# Patient Record
Sex: Male | Born: 1975 | Race: White | Hispanic: No | Marital: Married | Smoking: Never smoker
Health system: Southern US, Community
[De-identification: ages and names within clinical notes are randomized; demographics above are authoritative.]

## PROBLEM LIST (undated history)

## (undated) DIAGNOSIS — Z889 Allergy status to unspecified drugs, medicaments and biological substances status: Secondary | ICD-10-CM

## (undated) DIAGNOSIS — I10 Essential (primary) hypertension: Secondary | ICD-10-CM

## (undated) DIAGNOSIS — K635 Polyp of colon: Secondary | ICD-10-CM

## (undated) HISTORY — PX: NASAL SINUS SURGERY: SHX719

## (undated) HISTORY — PX: BACK SURGERY: SHX140

## (undated) HISTORY — DX: Polyp of colon: K63.5

## (undated) HISTORY — PX: CYST REMOVAL NECK: SHX6281

---

## 2009-02-15 DIAGNOSIS — Z9889 Other specified postprocedural states: Secondary | ICD-10-CM | POA: Insufficient documentation

## 2013-03-11 DIAGNOSIS — T170XXA Foreign body in nasal sinus, initial encounter: Secondary | ICD-10-CM | POA: Insufficient documentation

## 2013-03-11 DIAGNOSIS — J309 Allergic rhinitis, unspecified: Secondary | ICD-10-CM | POA: Insufficient documentation

## 2013-03-11 DIAGNOSIS — J329 Chronic sinusitis, unspecified: Secondary | ICD-10-CM | POA: Insufficient documentation

## 2015-02-14 DIAGNOSIS — Z720 Tobacco use: Secondary | ICD-10-CM | POA: Insufficient documentation

## 2015-07-07 DIAGNOSIS — K625 Hemorrhage of anus and rectum: Secondary | ICD-10-CM | POA: Insufficient documentation

## 2015-07-07 DIAGNOSIS — K641 Second degree hemorrhoids: Secondary | ICD-10-CM | POA: Insufficient documentation

## 2016-07-17 ENCOUNTER — Emergency Department
Admission: EM | Admit: 2016-07-17 | Discharge: 2016-07-18 | Disposition: A | Discharge status: Home / Self Care | Payer: 59 | Attending: Emergency Medicine | Admitting: Emergency Medicine

## 2016-07-17 ENCOUNTER — Encounter: Payer: Self-pay | Admitting: Emergency Medicine

## 2016-07-17 DIAGNOSIS — I10 Essential (primary) hypertension: Secondary | ICD-10-CM | POA: Diagnosis not present

## 2016-07-17 DIAGNOSIS — K529 Noninfective gastroenteritis and colitis, unspecified: Secondary | ICD-10-CM | POA: Diagnosis not present

## 2016-07-17 DIAGNOSIS — F17200 Nicotine dependence, unspecified, uncomplicated: Secondary | ICD-10-CM | POA: Diagnosis not present

## 2016-07-17 DIAGNOSIS — R1084 Generalized abdominal pain: Secondary | ICD-10-CM | POA: Diagnosis present

## 2016-07-17 HISTORY — DX: Allergy status to unspecified drugs, medicaments and biological substances: Z88.9

## 2016-07-17 HISTORY — DX: Essential (primary) hypertension: I10

## 2016-07-17 LAB — URINALYSIS, COMPLETE (UACMP) WITH MICROSCOPIC
BACTERIA UA: NONE SEEN
BILIRUBIN URINE: NEGATIVE
GLUCOSE, UA: NEGATIVE mg/dL
HGB URINE DIPSTICK: NEGATIVE
Ketones, ur: NEGATIVE mg/dL
LEUKOCYTES UA: NEGATIVE
NITRITE: NEGATIVE
Protein, ur: NEGATIVE mg/dL
SPECIFIC GRAVITY, URINE: 1.021 (ref 1.005–1.030)
Squamous Epithelial / LPF: NONE SEEN
pH: 5 (ref 5.0–8.0)

## 2016-07-17 LAB — COMPREHENSIVE METABOLIC PANEL
ALT: 46 U/L (ref 17–63)
ANION GAP: 8 (ref 5–15)
AST: 37 U/L (ref 15–41)
Albumin: 4.6 g/dL (ref 3.5–5.0)
Alkaline Phosphatase: 64 U/L (ref 38–126)
BILIRUBIN TOTAL: 0.6 mg/dL (ref 0.3–1.2)
BUN: 16 mg/dL (ref 6–20)
CALCIUM: 9.1 mg/dL (ref 8.9–10.3)
CO2: 25 mmol/L (ref 22–32)
Chloride: 99 mmol/L — ABNORMAL LOW (ref 101–111)
Creatinine, Ser: 0.69 mg/dL (ref 0.61–1.24)
GFR calc Af Amer: >60 mL/min (ref >60)
Glucose, Bld: 106 mg/dL — ABNORMAL HIGH (ref 65–99)
POTASSIUM: 3.6 mmol/L (ref 3.5–5.1)
Sodium: 132 mmol/L — ABNORMAL LOW (ref 135–145)
TOTAL PROTEIN: 8 g/dL (ref 6.5–8.1)

## 2016-07-17 LAB — CBC
HEMATOCRIT: 45.2 % (ref 40.0–52.0)
Hemoglobin: 15.3 g/dL (ref 13.0–18.0)
MCH: 28.8 pg (ref 26.0–34.0)
MCHC: 33.8 g/dL (ref 32.0–36.0)
MCV: 85.3 fL (ref 80.0–100.0)
Platelets: 252 10*3/uL (ref 150–440)
RBC: 5.3 MIL/uL (ref 4.40–5.90)
RDW: 12.7 % (ref 11.5–14.5)
WBC: 11.9 10*3/uL — AB (ref 3.8–10.6)

## 2016-07-17 LAB — LIPASE, BLOOD: Lipase: 21 U/L (ref 11–51)

## 2016-07-17 MED ORDER — SODIUM CHLORIDE 0.9 % IV BOLUS (SEPSIS)
1000.0000 mL | Freq: Once | INTRAVENOUS | Status: AC
  Administered 2016-07-17: 1000 mL via INTRAVENOUS

## 2016-07-17 NOTE — ED Provider Notes (Signed)
[facility] Emergency Department Provider Note   First MD Initiated Contact with Patient 07/17/16 2316     (approximate)  I have reviewed the triage vital signs and the nursing notes.   HISTORY  Chief Complaint Abdominal Pain and Diarrhea   HPI Joseph Gamble is a 41 y.o. male resents with 2 day history of generalized abdominal cramping associated with multiple episodes of nonbloody diarrhea. Patient denies any fever. Patient denies any recent antibiotic use. Patient does state that multiple members of his family following Easter holiday this past week and a bad episodes of diarrhea however it seems that "mind is more severe".   Past Medical History:  Diagnosis Date  . Hx of seasonal allergies   . Hypertension     There are no active problems to display for this patient.   Past Surgical History:  Procedure Laterality Date  . NASAL SINUS SURGERY      Prior to Admission medications   Not on File    Allergies Septra [sulfamethoxazole-trimethoprim]  No family history on file.  Social History Social History  Substance Use Topics  . Smoking status: Never Smoker  . Smokeless tobacco: Current User  . Alcohol use Yes     Comment: occasional    Review of Systems Constitutional: No fever/chills Eyes: No visual changes. ENT: No sore throat. Cardiovascular: Denies chest pain. Respiratory: Denies shortness of breath. Gastrointestinal: Positive for abdominal pain and diarrhea.  Genitourinary: Negative for dysuria. Musculoskeletal: Negative for back pain. Skin: Negative for rash. Neurological: Negative for headaches, focal weakness or numbness.  10-point ROS otherwise negative.  ____________________________________________   PHYSICAL EXAM:  VITAL SIGNS: ED Triage Vitals [07/17/16 1929]  Enc Vitals Group     BP 129/84     Pulse Rate 92     Resp 18     Temp 98.2 F (36.8 C)     Temp Source Oral     SpO2 99 %     Weight 193 lb  (87.5 kg)     Height  (1.753 m)     Head Circumference      Peak Flow      Pain Score      Pain Loc      Pain Edu?      Excl. in GC?     Constitutional: Alert and oriented. Well appearing and in no acute distress. Eyes: Conjunctivae are normal. PERRL. EOMI. Head: Atraumatic. Mouth/Throat: Mucous membranes are moist. Oropharynx non-erythematous. Neck: No stridor.  Cardiovascular: Normal rate, regular rhythm. Good peripheral circulation. Grossly normal heart sounds. Respiratory: Normal respiratory effort.  No retractions. Lungs CTAB. Gastrointestinal: Soft and nontender. No distention.  Musculoskeletal: No lower extremity tenderness nor edema. No gross deformities of extremities. Neurologic:  Normal speech and language. No gross focal neurologic deficits are appreciated.  Skin:  Skin is warm, dry and intact. No rash noted. Psychiatric: Mood and affect are normal. Speech and behavior are normal.  ____________________________________________   LABS (all labs ordered are listed, but only abnormal results are displayed)  Labs Reviewed  COMPREHENSIVE METABOLIC PANEL - Abnormal; Notable for the following:       Result Value   Sodium 132 (*)    Chloride 99 (*)    Glucose, Bld 106 (*)    All other components within normal limits  CBC - Abnormal; Notable for the following:    WBC 11.9 (*)    All other components within normal limits  URINALYSIS, COMPLETE (UACMP) WITH MICROSCOPIC -  Abnormal; Notable for the following:    Color, Urine YELLOW (*)    APPearance CLEAR (*)    All other components within normal limits  GASTROINTESTINAL PANEL BY PCR, STOOL (REPLACES STOOL CULTURE)  LIPASE, BLOOD     RADIOLOGY I, Hitchcock N Riot Waterworth, personally viewed and evaluated these images (plain radiographs) as part of my medical decision making, as well as reviewing the written report by the radiologist.  Ct Abdomen Pelvis W Contrast  Result Date: 07/18/2016 CLINICAL DATA:  41 y/o  M; nausea  and diarrhea with abdominal pain. EXAM: CT ABDOMEN AND PELVIS WITH CONTRAST TECHNIQUE: Multidetector CT imaging of the abdomen and pelvis was performed using the standard protocol following bolus administration of intravenous contrast. CONTRAST:  ISOVUE-300 IOPAMIDOL (ISOVUE-300) INJECTION 61% COMPARISON:  None. FINDINGS: Lower chest: No acute abnormality. Hepatobiliary: Liver segment 7 16 x 9 mm low-attenuation focus probably representing a liver cysts. Otherwise no focal liver abnormality is seen. No gallstones, gallbladder wall thickening, or biliary dilatation. Pancreas: Unremarkable. No pancreatic ductal dilatation or surrounding inflammatory changes. Spleen: Normal in size without focal abnormality. Adrenals/Urinary Tract: Multiple right kidney nonobstructing stones measuring up to 5 mm in the interpolar region. No hydronephrosis or obstructive uropathy. Normal bladder. Normal adrenal glands. Stomach/Bowel: Mild gastric distention. Normal appendix. Moderate diffuse wall thickening of the colon greatest in the right transverse and ascending segments compatible with colitis. Additionally, there is mild wall thickening of the terminal ileum (series 5, image 39). Small bowel is otherwise unremarkable. Vascular/Lymphatic: No significant vascular findings are present. No enlarged abdominal or pelvic lymph nodes. Reproductive: Central prostatic calcifications and mild hypertrophy. Other: No abdominal wall hernia or abnormality. No abdominopelvic ascites. Musculoskeletal: No fracture is seen. Moderate lumbar spondylosis at the L5-S1 with loss of disc space height. IMPRESSION: 1. Diffuse colitis greatest in the ascending and right transverse segments which may be infectious or inflammatory including ulcerative colitis or Crohn's disease. Additionally, there are inflammatory changes of the terminal ileum. No evidence for perforation or pericolonic abscess at this time. 2. Right kidney nonobstructing  nephrolithiasis measuring up to 5 mm in the interpolar region. Electronically Signed   By: Mitzi Hansen M.D.   On: 07/18/2016 01:17    ____________________________________________    Procedures   ____________________________________________   INITIAL IMPRESSION / ASSESSMENT AND PLAN / ED COURSE  Pertinent labs & imaging results that were available during my care of the patient were reviewed by me and considered in my medical decision making (see chart for details).  Patient to 2 L IV normal saline. CT scan consistent with colitis. Patient is advised to use Imodium at home for any further diarrhea. Patient unable to give a stool sample in the emergency department secondary to no bowel movements while in the ED.      ____________________________________________  FINAL CLINICAL IMPRESSION(S) / ED DIAGNOSES  Final diagnoses:  Colitis     MEDICATIONS GIVEN DURING THIS VISIT:  Medications  sodium chloride 0.9 % bolus 1,000 mL (0 mLs Intravenous Stopped 07/18/16 0126)  sodium chloride 0.9 % bolus 1,000 mL (0 mLs Intravenous Stopped 07/18/16 0126)  iopamidol (ISOVUE-300) 61 % injection 30 mL (30 mLs Oral Contrast Given 07/18/16 0031)  iopamidol (ISOVUE-300) 61 % injection 100 mL (100 mLs Intravenous Contrast Given 07/18/16 0045)     NEW OUTPATIENT MEDICATIONS STARTED DURING THIS VISIT:  There are no discharge medications for this patient.   There are no discharge medications for this patient.   There are no discharge medications for  this patient.    Note:  This document was prepared using Dragon voice recognition software and may include unintentional dictation errors.    Darci Current, MD 07/18/16 [phone]

## 2016-07-17 NOTE — ED Triage Notes (Signed)
Patient ambulatory to triage with steady gait, without difficulty or distress noted; pt reports nausea and diarrhea with abd pain since Sunday

## 2016-07-18 ENCOUNTER — Emergency Department: Payer: 59

## 2016-07-18 ENCOUNTER — Encounter: Payer: Self-pay | Admitting: Radiology

## 2016-07-18 MED ORDER — IOPAMIDOL (ISOVUE-300) INJECTION 61%
30.0000 mL | Freq: Once | INTRAVENOUS | Status: AC | PRN
  Administered 2016-07-18: 30 mL via ORAL

## 2016-07-18 MED ORDER — IOPAMIDOL (ISOVUE-300) INJECTION 61%
100.0000 mL | Freq: Once | INTRAVENOUS | Status: AC | PRN
  Administered 2016-07-18: 100 mL via INTRAVENOUS

## 2016-07-18 NOTE — ED Notes (Signed)
Patient transported to CT at this time. 

## 2016-07-18 NOTE — ED Notes (Signed)
Pt returned to ED Rm 25 from CT at this time. 

## 2016-08-09 ENCOUNTER — Ambulatory Visit (INDEPENDENT_AMBULATORY_CARE_PROVIDER_SITE_OTHER): Payer: 59 | Admitting: Gastroenterology

## 2016-08-09 ENCOUNTER — Telehealth: Payer: Self-pay

## 2016-08-09 ENCOUNTER — Encounter: Payer: Self-pay | Admitting: Gastroenterology

## 2016-08-09 ENCOUNTER — Other Ambulatory Visit: Payer: Self-pay

## 2016-08-09 VITALS — BP 129/85 | HR 59 | Temp 98.3°F | Resp 16 | Ht 69.0 in | Wt 195.0 lb

## 2016-08-09 DIAGNOSIS — K5190 Ulcerative colitis, unspecified, without complications: Secondary | ICD-10-CM | POA: Diagnosis not present

## 2016-08-09 NOTE — Progress Notes (Signed)
Gastroenterology Consultation  Referring Provider:  Dr Manson Passey  Primary Care Physician:  No PCP Per Patient Primary Gastroenterologist:  Dr. Wyline Mood  Reason for Consultation:     Colitis        HPI:   Joseph Gamble is a 41 y.o. y/o male referred for colitis. He has been referred for Colitis.  He presented to the ER on 07/17/16 with abdominal pain and non bloody bowel movements. He underwent a CT scan of the abdomen which demonstrated diffuse colitis of the ascending ,right , transverse colon as well as the terminal ileum which could be seen in crohns disease or an infectious enterocolitis.   I do note that he was seen by GI back in 2017 at [facility] for rectal bleeding and was recommended a colonoscopy. It revealed loss of vascular pattern in the rectum . Biopsies of the right and left colon were normal whereas biopsies of the rectum showed proctitis, cryptitis and features of chronicity.   Labs 07/17/16- Hb 15.3  He says after his last colonoscopy was not followed by his old GI. After that he says that his symptoms did not resolve, used topical hydrocortisone for hemorroids, on and off rectal bleeding , no diarrhea, no cramping.  He says that in 2013 was using a lot of Alleve for back pain.  He says that he went to the ER after a lot of travelling on vacation , Over the last 3 months taken alleve about once a week - not more than twice.   When he went to the ER had some rectal bleeding going on for a couple of weeks, cramping , severe pain , no nocturnal bowel movements, severe diarrhea for about 5 days , no fevers, no joint pains or skin rashes, no weight loss.  Presently no diarrhea, small qty of rectal bleeding.  He quit smoking 5 years back.   Past Medical History:  Diagnosis Date  . Hx of seasonal allergies   . Hypertension     Past Surgical History:  Procedure Laterality Date  . NASAL SINUS SURGERY      Prior to Admission medications   Medication Sig  Start Date End Date Taking? Authorizing Provider  amoxicillin-clavulanate (AUGMENTIN) 875-125 MG tablet  05/24/16   Historical Provider, MD  atenolol (TENORMIN) 50 MG tablet  07/21/16   Historical Provider, MD  fluticasone (FLONASE) 50 MCG/ACT nasal spray  07/02/16   Historical Provider, MD  hydrocortisone (CORTENEMA) 100 MG/60ML enema I 1 ENEMA REC Q 12 H 07/23/16   Historical Provider, MD  naproxen sodium (ANAPROX) 220 MG tablet Take by mouth.    Historical Provider, MD  predniSONE (DELTASONE) 10 MG tablet  05/24/16   Historical Provider, MD    No family history on file.   Social History  Substance Use Topics  . Smoking status: Never Smoker  . Smokeless tobacco: Current User  . Alcohol use Yes     Comment: occasional    Allergies as of 08/09/2016 - Review Complete 07/18/2016  Allergen Reaction Noted  . Septra [sulfamethoxazole-trimethoprim] Hives and Rash 01/21/2009    Review of Systems:    All systems reviewed and negative except where noted in HPI.   Physical Exam:  There were no vitals taken for this visit. No LMP for male patient. Psych:  Alert and cooperative. Normal mood and affect. General:   Alert,  Well-developed, well-nourished, pleasant and cooperative in NAD Head:  Normocephalic and atraumatic. Eyes:  Sclera clear, no icterus.  Conjunctiva pink. Ears:  Normal auditory acuity. Nose:  No deformity, discharge, or lesions. Mouth:  No deformity or lesions,oropharynx pink & moist. Neck:  Supple; no masses or thyromegaly. Lungs:  Respirations even and unlabored.  Clear throughout to auscultation.   No wheezes, crackles, or rhonchi. No acute distress. Heart:  Regular rate and rhythm; no murmurs, clicks, rubs, or gallops. Abdomen:  Normal bowel sounds.  No bruits.  Soft, non-tender and non-distended without masses, hepatosplenomegaly or hernias noted.  No guarding or rebound tenderness.    Msk:  Symmetrical without gross deformities. Good, equal movement & strength  bilaterally. Neurologic:  Alert and oriented x3;  grossly normal neurologically. Psych:  Alert and cooperative. Normal mood and affect.  Imaging Studies: Ct Abdomen Pelvis W Contrast  Result Date: 07/18/2016 CLINICAL DATA:  41 y/o  M; nausea and diarrhea with abdominal pain. EXAM: CT ABDOMEN AND PELVIS WITH CONTRAST TECHNIQUE: Multidetector CT imaging of the abdomen and pelvis was performed using the standard protocol following bolus administration of intravenous contrast. CONTRAST:  ISOVUE-300 IOPAMIDOL (ISOVUE-300) INJECTION 61% COMPARISON:  None. FINDINGS: Lower chest: No acute abnormality. Hepatobiliary: Liver segment 7 16 x 9 mm low-attenuation focus probably representing a liver cysts. Otherwise no focal liver abnormality is seen. No gallstones, gallbladder wall thickening, or biliary dilatation. Pancreas: Unremarkable. No pancreatic ductal dilatation or surrounding inflammatory changes. Spleen: Normal in size without focal abnormality. Adrenals/Urinary Tract: Multiple right kidney nonobstructing stones measuring up to 5 mm in the interpolar region. No hydronephrosis or obstructive uropathy. Normal bladder. Normal adrenal glands. Stomach/Bowel: Mild gastric distention. Normal appendix. Moderate diffuse wall thickening of the colon greatest in the right transverse and ascending segments compatible with colitis. Additionally, there is mild wall thickening of the terminal ileum (series 5, image 39). Small bowel is otherwise unremarkable. Vascular/Lymphatic: No significant vascular findings are present. No enlarged abdominal or pelvic lymph nodes. Reproductive: Central prostatic calcifications and mild hypertrophy. Other: No abdominal wall hernia or abnormality. No abdominopelvic ascites. Musculoskeletal: No fracture is seen. Moderate lumbar spondylosis at the L5-S1 with loss of disc space height. IMPRESSION: 1. Diffuse colitis greatest in the ascending and right transverse segments which may be  infectious or inflammatory including ulcerative colitis or Crohn's disease. Additionally, there are inflammatory changes of the terminal ileum. No evidence for perforation or pericolonic abscess at this time. 2. Right kidney nonobstructing nephrolithiasis measuring up to 5 mm in the interpolar region. Electronically Signed   By: Mitzi Hansen M.D.   On: 07/18/2016 01:17    Assessment and Plan:   Joseph Gamble is a 41 y.o. y/o male has been referred for colitis.  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 The present Ct scan findings suggest a colitis extending to the right side. 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   Plan   1. Colonoscopy .  2. Fecal lactoferrin,CRP,stool studies  3. No NSAID's  Follow up in 8 weeks   Dr Wyline Mood MD

## 2016-08-09 NOTE — Telephone Encounter (Signed)
Gastroenterology Pre-Procedure Review  Request Date: 5/3 Requesting Physician: Dr. Tobi Bastos  PATIENT REVIEW QUESTIONS: The patient responded to the following health history questions as indicated:    1. Are you having any GI issues? yes (rectal bleeding) 2. Do you have a personal history of Polyps? yes (removed) 3. Do you have a family history of Colon Cancer or Polyps? no 4. Diabetes Mellitus? no 5. Joint replacements in the past 12 months?no 6. Major health problems in the past 3 months?no 7. Any artificial heart valves, MVP, or defibrillator?no    MEDICATIONS & ALLERGIES:    Patient reports the following regarding taking any anticoagulation/antiplatelet therapy:   Plavix, Coumadin, Eliquis, Xarelto, Lovenox, Pradaxa, Brilinta, or Effient? no Aspirin? no  Patient confirms/reports the following medications:  Current Outpatient Prescriptions  Medication Sig Dispense Refill  . amoxicillin-clavulanate (AUGMENTIN) 875-125 MG tablet     . atenolol (TENORMIN) 50 MG tablet     . fluticasone (FLONASE) 50 MCG/ACT nasal spray     . fluticasone (FLONASE) 50 MCG/ACT nasal spray     . hydrocortisone (CORTENEMA) 100 MG/60ML enema I 1 ENEMA REC Q 12 H  0  . naproxen sodium (ANAPROX) 220 MG tablet Take by mouth.    . predniSONE (DELTASONE) 10 MG tablet      No current facility-administered medications for this visit.     Patient confirms/reports the following allergies:  Allergies  Allergen Reactions  . Septra [Sulfamethoxazole-Trimethoprim] Hives and Rash    No orders of the defined types were placed in this encounter.   AUTHORIZATION INFORMATION Primary Insurance: 1D#: Group #:  Secondary Insurance: 1D#: Group #:  SCHEDULE INFORMATION: Date: 5/3 Time: Location: [facility]

## 2016-08-13 ENCOUNTER — Telehealth: Payer: Self-pay | Admitting: *Deleted

## 2016-08-13 NOTE — Telephone Encounter (Signed)
08/13/16 Spoke with Joseph Gamble at [facility] for Colonoscopy [address] / K51.91 Auth # [id number].

## 2016-08-15 ENCOUNTER — Telehealth: Payer: Self-pay | Admitting: Gastroenterology

## 2016-08-15 ENCOUNTER — Encounter: Payer: Self-pay | Admitting: *Deleted

## 2016-08-15 ENCOUNTER — Other Ambulatory Visit: Payer: Self-pay

## 2016-08-15 DIAGNOSIS — K51911 Ulcerative colitis, unspecified with rectal bleeding: Secondary | ICD-10-CM

## 2016-08-15 NOTE — Telephone Encounter (Signed)
08/15/16 Patient was r/s to [facility]. [facility] website Prior Auth Reference #: [id number]

## 2016-08-15 NOTE — Telephone Encounter (Signed)
08/15/16 Joseph Gamble with [employer] called and Joseph Gamble's Colonoscopy was denied for [facility]. Needs a peer to peer or r/s to [facility].

## 2016-08-16 NOTE — Discharge Instructions (Signed)

## 2016-08-17 ENCOUNTER — Encounter: Admission: RE | Payer: Self-pay | Source: Ambulatory Visit

## 2016-08-17 ENCOUNTER — Ambulatory Visit: Payer: 59 | Admitting: Anesthesiology

## 2016-08-17 ENCOUNTER — Encounter
Admission: RE | Disposition: A | Discharge status: Home / Self Care | Payer: Self-pay | Source: Ambulatory Visit | Attending: Gastroenterology

## 2016-08-17 ENCOUNTER — Ambulatory Visit: Admission: RE | Admit: 2016-08-17 | Payer: 59 | Source: Ambulatory Visit | Admitting: Gastroenterology

## 2016-08-17 ENCOUNTER — Telehealth: Payer: Self-pay | Admitting: Gastroenterology

## 2016-08-17 ENCOUNTER — Ambulatory Visit
Admission: RE | Admit: 2016-08-17 | Discharge: 2016-08-17 | Disposition: A | Discharge status: Home / Self Care | Payer: 59 | Source: Ambulatory Visit | Attending: Gastroenterology | Admitting: Gastroenterology

## 2016-08-17 DIAGNOSIS — I10 Essential (primary) hypertension: Secondary | ICD-10-CM | POA: Insufficient documentation

## 2016-08-17 DIAGNOSIS — Z7951 Long term (current) use of inhaled steroids: Secondary | ICD-10-CM | POA: Diagnosis not present

## 2016-08-17 DIAGNOSIS — Z791 Long term (current) use of non-steroidal anti-inflammatories (NSAID): Secondary | ICD-10-CM | POA: Diagnosis not present

## 2016-08-17 DIAGNOSIS — F17290 Nicotine dependence, other tobacco product, uncomplicated: Secondary | ICD-10-CM | POA: Diagnosis not present

## 2016-08-17 DIAGNOSIS — Z79899 Other long term (current) drug therapy: Secondary | ICD-10-CM | POA: Insufficient documentation

## 2016-08-17 DIAGNOSIS — R933 Abnormal findings on diagnostic imaging of other parts of digestive tract: Secondary | ICD-10-CM | POA: Diagnosis present

## 2016-08-17 DIAGNOSIS — Z86010 Personal history of colonic polyps: Secondary | ICD-10-CM | POA: Diagnosis not present

## 2016-08-17 DIAGNOSIS — K529 Noninfective gastroenteritis and colitis, unspecified: Secondary | ICD-10-CM | POA: Insufficient documentation

## 2016-08-17 DIAGNOSIS — K633 Ulcer of intestine: Secondary | ICD-10-CM | POA: Diagnosis not present

## 2016-08-17 HISTORY — PX: COLONOSCOPY WITH PROPOFOL: SHX5780

## 2016-08-17 SURGERY — COLONOSCOPY WITH PROPOFOL
Anesthesia: Monitor Anesthesia Care | Wound class: Contaminated

## 2016-08-17 SURGERY — COLONOSCOPY WITH PROPOFOL
Anesthesia: General

## 2016-08-17 MED ORDER — LACTATED RINGERS IV SOLN
INTRAVENOUS | Status: DC
  Administered 2016-08-17: 09:00:00 via INTRAVENOUS

## 2016-08-17 MED ORDER — PROPOFOL 10 MG/ML IV BOLUS
INTRAVENOUS | Status: DC | PRN
Start: 2016-08-17 — End: 2016-08-17
  Administered 2016-08-17 (×3): 20 mg via INTRAVENOUS
  Administered 2016-08-17 (×2): 10 mg via INTRAVENOUS
  Administered 2016-08-17: 20 mg via INTRAVENOUS
  Administered 2016-08-17: 70 mg via INTRAVENOUS
  Administered 2016-08-17: 20 mg via INTRAVENOUS

## 2016-08-17 MED ORDER — LIDOCAINE HCL (CARDIAC) 20 MG/ML IV SOLN
INTRAVENOUS | Status: DC | PRN
  Administered 2016-08-17: 50 mg via INTRAVENOUS

## 2016-08-17 MED ORDER — STERILE WATER FOR IRRIGATION IR SOLN
Status: DC | PRN
  Administered 2016-08-17: 10:00:00

## 2016-08-17 SURGICAL SUPPLY — 23 items

## 2016-08-17 NOTE — Op Note (Signed)
[facility] Gastroenterology Patient Name: Joseph Gamble Procedure Date: 08/17/2016 9:34 AM MRN: [id number] Account #: [id number] Date of Birth: 1975-10-06 Admit Type: Outpatient Age: 41 Room: [facility] OR ROOM 01 Gender: Male Note Status: Finalized Procedure:            Colonoscopy Indications:          Abnormal CT of the GI tract Providers:            Midge Minium MD, MD Medicines:            Propofol per Anesthesia Complications:        No immediate complications. Procedure:            Pre-Anesthesia Assessment:                       - Prior to the procedure, a History and Physical was                        performed, and patient medications and allergies were                        reviewed. The patient's tolerance of previous                        anesthesia was also reviewed. The risks and benefits of                        the procedure and the sedation options and risks were                        discussed with the patient. All questions were                        answered, and informed consent was obtained. Prior                        Anticoagulants: The patient has taken no previous                        anticoagulant or antiplatelet agents. ASA Grade                        Assessment: II - A patient with mild systemic disease.                        After reviewing the risks and benefits, the patient was                        deemed in satisfactory condition to undergo the                        procedure.                       After obtaining informed consent, the colonoscope was                        passed under direct vision. Throughout the procedure,                        the patient's blood  pressure, pulse, and oxygen                        saturations were monitored continuously. The Olympus CF                        H180AL Colonoscope (S#: [id number]) was introduced through                        the anus and advanced to the the terminal  ileum. The                        colonoscopy was performed without difficulty. The                        patient tolerated the procedure well. The quality of                        the bowel preparation was excellent. Findings:      The perianal and digital rectal examinations were normal.      The terminal ileum contained a single (solitary) ulcer. No bleeding was       present. Biopsies were taken with a cold forceps for histology.      A localized area of mildly erythematous mucosa was found in the sigmoid       colon. Random biopsies were obtained in the entire colon with cold       forceps for histology. Impression:           - A single (solitary) ulcer in the terminal ileum.                        Biopsied.                       - Erythematous mucosa in the sigmoid colon.                       - Random biopsies were obtained in the entire colon. Recommendation:       - Discharge patient to home.                       - Resume previous diet.                       - Continue present medications.                       - Await pathology results. Procedure Code(s):    --- Professional ---                       [phone], Colonoscopy, flexible; with biopsy, single or                        multiple Diagnosis Code(s):    --- Professional ---                       R93.3, Abnormal findings on diagnostic imaging of other                        parts of digestive tract  K63.3, Ulcer of intestine                       K63.89, Other specified diseases of intestine CPT copyright 2016 American Medical Association. All rights reserved. The codes documented in this report are preliminary and upon coder review may  be revised to meet current compliance requirements. [name] Garnetta Fedrick MD, MD 08/17/2016 10:05:22 AM This report has been signed electronically. Number of Addenda: 0 Note Initiated On: 08/17/2016 9:34 AM Scope Withdrawal Time: 0 hours 8 minutes 0 seconds  Total Procedure  Duration: 0 hours 10 minutes 15 seconds       [facility]

## 2016-08-17 NOTE — H&P (Signed)
   [name] Soyla Bainter, MD [facility] [address] [place], [place] [address] Phone:[phone] Fax : [phone]  Primary Care Physician:  No PCP Per Patient Primary Gastroenterologist:  Dr. Servando SnareWohl  Pre-Procedure History & Physical: HPI:  Joseph Gamble is a 41 y.o. male is here for an colonoscopy.   Past Medical History:  Diagnosis Date  . Colon polyp   . Hx of seasonal allergies   . Hypertension     Past Surgical History:  Procedure Laterality Date  . BACK SURGERY     ? 2013  . CYST REMOVAL NECK    . NASAL SINUS SURGERY      Prior to Admission medications   Medication Sig Start Date End Date Taking? Authorizing Provider  atenolol (TENORMIN) 50 MG tablet  07/21/16  Yes Historical Provider, MD  fluticasone (FLONASE) 50 MCG/ACT nasal spray  07/02/16  Yes Historical Provider, MD  montelukast (SINGULAIR) 10 MG tablet Take 10 mg by mouth at bedtime.   Yes Historical Provider, MD  Multiple Vitamins-Minerals (MULTIVITAMIN ADULT PO) Take by mouth.   Yes Historical Provider, MD  vitamin C (ASCORBIC ACID) 500 MG tablet Take 500 mg by mouth daily.   Yes Historical Provider, MD  hydrocortisone (CORTENEMA) 100 MG/60ML enema I 1 ENEMA REC Q 12 H 07/23/16   Historical Provider, MD  naproxen sodium (ANAPROX) 220 MG tablet Take by mouth.    Historical Provider, MD    Allergies as of 08/15/2016 - Review Complete 08/15/2016  Allergen Reaction Noted  . Septra [sulfamethoxazole-trimethoprim] Hives and Rash 01/21/2009    History reviewed. No pertinent family history.  Social History   Social History  . Marital status: Married    Spouse name: N/A  . Number of children: N/A  . Years of education: N/A   Occupational History  . Not on file.   Social History Main Topics  . Smoking status: Current Some Day Smoker    Types: E-cigarettes  . Smokeless tobacco: Never Used     Comment: quit smoking traditional cigs 2015, E-cig 1x/day  . Alcohol use 5.4 oz/week    9 Glasses of wine per week    Comment:    . Drug use: Unknown  . Sexual activity: Not on file   Other Topics Concern  . Not on file   Social History Narrative  . No narrative on file    Review of Systems: See HPI, otherwise negative ROS  Physical Exam: BP 120/82   Pulse (!) 56   Temp 98.2 F (36.8 C) (Temporal)   Resp 16   Ht 5\' 9"  (1.753 m)   Wt 181 lb (82.1 kg)   SpO2 100%   BMI 26.73 kg/m  General:   Alert,  pleasant and cooperative in NAD Head:  Normocephalic and atraumatic. Neck:  Supple; no masses or thyromegaly. Lungs:  Clear throughout to auscultation.    Heart:  Regular rate and rhythm. Abdomen:  Soft, nontender and nondistended. Normal bowel sounds, without guarding, and without rebound.   Neurologic:  Alert and  oriented x4;  grossly normal neurologically.  Impression/Plan: Joseph ChimesJoshua Gamble is here for an colonoscopy to be performed for colitis  Risks, benefits, limitations, and alternatives regarding  colonoscopy have been reviewed with the patient.  Questions have been answered.  All parties agreeable.   [name] [name], MD  08/17/2016, 9:34 AM

## 2016-08-17 NOTE — Anesthesia Preprocedure Evaluation (Signed)
Anesthesia Evaluation  Patient identified by MRN, date of birth, ID band Patient awake    Reviewed: Allergy & Precautions, H&P , NPO status , Patient's Chart, lab work & pertinent test results  Airway Mallampati: II  TM Distance: >3 FB Neck ROM: full    Dental no notable dental hx.    Pulmonary Current Smoker,    Pulmonary exam normal        Cardiovascular hypertension, Normal cardiovascular exam     Neuro/Psych    GI/Hepatic   Endo/Other    Renal/GU      Musculoskeletal   Abdominal   Peds  Hematology   Anesthesia Other Findings   Reproductive/Obstetrics                             Anesthesia Physical Anesthesia Plan  ASA: II  Anesthesia Plan: MAC   Post-op Pain Management:    Induction:   Airway Management Planned:   Additional Equipment:   Intra-op Plan:   Post-operative Plan:   Informed Consent: I have reviewed the patients History and Physical, chart, labs and discussed the procedure including the risks, benefits and alternatives for the proposed anesthesia with the patient or authorized representative who has indicated his/her understanding and acceptance.     Plan Discussed with:   Anesthesia Plan Comments:         Anesthesia Quick Evaluation

## 2016-08-17 NOTE — Anesthesia Postprocedure Evaluation (Signed)
Anesthesia Post Note  Patient: Joseph Gamble  Procedure(s) Performed: Procedure(s) (LRB): COLONOSCOPY WITH PROPOFOL (N/A)  Patient location during evaluation: PACU Anesthesia Type: MAC Level of consciousness: awake and alert and oriented Pain management: satisfactory to patient Vital Signs Assessment: post-procedure vital signs reviewed and stable Respiratory status: spontaneous breathing, nonlabored ventilation and respiratory function stable Cardiovascular status: blood pressure returned to baseline and stable Postop Assessment: Adequate PO intake and No signs of nausea or vomiting Anesthetic complications: no    Raliegh Ip

## 2016-08-17 NOTE — Transfer of Care (Signed)
Immediate Anesthesia Transfer of Care Note  Patient: Joseph Gamble  Procedure(s) Performed: Procedure(s): COLONOSCOPY WITH PROPOFOL (N/A)  Patient Location: PACU  Anesthesia Type: MAC  Level of Consciousness: awake, alert  and patient cooperative  Airway and Oxygen Therapy: Patient Spontanous Breathing and Patient connected to supplemental oxygen  Post-op Assessment: Post-op Vital signs reviewed, Patient's Cardiovascular Status Stable, Respiratory Function Stable, Patent Airway and No signs of Nausea or vomiting  Post-op Vital Signs: Reviewed and stable  Complications: No apparent anesthesia complications

## 2016-08-17 NOTE — Anesthesia Procedure Notes (Signed)
Procedure Name: MAC Performed by: Mayme Genta Pre-anesthesia Checklist: Patient identified, Emergency Drugs available, Suction available, Timeout performed and Patient being monitored Patient Re-evaluated:Patient Re-evaluated prior to inductionOxygen Delivery Method: Nasal cannula Placement Confirmation: positive ETCO2

## 2016-08-17 NOTE — Telephone Encounter (Signed)
08[facility]17/16 [facility] [id number] for Colonoscopy [id number] / K51.90 Dr. Servando SnareWohl / [facility]

## 2016-08-20 ENCOUNTER — Encounter: Payer: Self-pay | Admitting: Gastroenterology

## 2016-08-24 ENCOUNTER — Telehealth: Payer: Self-pay

## 2016-08-24 NOTE — Telephone Encounter (Signed)
-----   Message from [name], MD sent at 08/20/2016  8:26 PM EDT ----- Please have the patient come in for a follow up. 

## 2016-08-24 NOTE — Telephone Encounter (Signed)
Pt scheduled for a follow up appt with Dr. Servando SnareWohl on Thursday, May 24th.

## 2016-09-06 ENCOUNTER — Other Ambulatory Visit: Payer: Self-pay

## 2016-09-06 ENCOUNTER — Encounter: Payer: Self-pay | Admitting: Gastroenterology

## 2016-09-06 ENCOUNTER — Ambulatory Visit (INDEPENDENT_AMBULATORY_CARE_PROVIDER_SITE_OTHER): Payer: 59 | Admitting: Gastroenterology

## 2016-09-06 VITALS — BP 140/93 | HR 71 | Temp 98.4°F | Ht 69.0 in | Wt 193.0 lb

## 2016-09-06 DIAGNOSIS — K633 Ulcer of intestine: Secondary | ICD-10-CM | POA: Diagnosis not present

## 2016-09-06 NOTE — Progress Notes (Signed)
    Primary Care Physician: Patient, No Pcp Per  Primary Gastroenterologist:  Dr. [name] Jeliyah Middlebrooks  Chief Complaint  Patient presents with  . Follow-up    HPI: Joseph Gamble is a 41 y.o. male here for follow-up after having a colonoscopy. The patient had a solitary ulcer in the terminal ileum despite his CT scan showing inflammation in the transverse colon descending colon and terminal ileum. The biopsies showed some active ileitis consistent with possible NSAIDs that the patient denies versus possible inflammatory bowel disease. The patient has no reports of any diarrhea or constipation at the present time but does report that he is having some anal itching and burning. There is no report of any black stools or bloody stools.  Current Outpatient Prescriptions  Medication Sig Dispense Refill  . atenolol (TENORMIN) 50 MG tablet     . fluticasone (FLONASE) 50 MCG/ACT nasal spray     . hydrocortisone (CORTENEMA) 100 MG/60ML enema I 1 ENEMA REC Q 12 H  0  . montelukast (SINGULAIR) 10 MG tablet Take 10 mg by mouth at bedtime.    . Multiple Vitamins-Minerals (MULTIVITAMIN ADULT PO) Take by mouth.    . naproxen sodium (ANAPROX) 220 MG tablet Take by mouth.    . vitamin C (ASCORBIC ACID) 500 MG tablet Take 500 mg by mouth daily.     No current facility-administered medications for this visit.     Allergies as of 09/06/2016 - Review Complete 08/17/2016  Allergen Reaction Noted  . Septra [sulfamethoxazole-trimethoprim] Hives and Rash 01/21/2009    ROS:  General: Negative for anorexia, weight loss, fever, chills, fatigue, weakness. ENT: Negative for hoarseness, difficulty swallowing , nasal congestion. CV: Negative for chest pain, angina, palpitations, dyspnea on exertion, peripheral edema.  Respiratory: Negative for dyspnea at rest, dyspnea on exertion, cough, sputum, wheezing.  GI: See history of present illness. GU:  Negative for dysuria, hematuria, urinary incontinence, urinary frequency,  nocturnal urination.  Endo: Negative for unusual weight change.    Physical Examination:   BP (!) 140/93   Pulse 71   Temp 98.4 F (36.9 C) (Oral)   Ht 5\' 9"  (1.753 m)   Wt 193 lb (87.5 kg)   BMI 28.50 kg/m   General: Well-nourished, well-developed in no acute distress.  Eyes: No icterus. Conjunctivae pink. Neuro: Alert and oriented x 3.  Grossly intact. Skin: Warm and dry, no jaundice.   Psych: Alert and cooperative, normal mood and affect.  Labs:    Imaging Studies: No results found.  Assessment and Plan:   Joseph ChimesJoshua Grandfield is a 41 y.o. y/o male who had a colonoscopy with a also the terminal ileum that was biopsied and showed acute ileitis. The patient had not been on NSAIDs. The patient is not having any symptoms consistent with Crohn's disease at this time. The patient will have a small bowel follow-through done to look for any other abnormalities in the small bowel and he will also be set up for a blood test for an IBD panel. The patient has been told to use baby wipes without any alcohol on his anal pruritus and keep the area clean and dry.    [name] Avalyn Molino, MD. Clementeen GrahamFACG   Note: This dictation was prepared with Dragon dictation along with smaller phrase technology. Any transcriptional errors that result from this process are unintentional.

## 2016-09-06 NOTE — Patient Instructions (Signed)
[facility] @ 8:00am. Please at the medical mall at 7:45am. You cannot have anything to eat or drink after midnight Sunday night.   If you need to reschedule this appt for any reason, please contact central scheduling at [phone].

## 2016-09-13 LAB — IBD EXPANDED PANEL
ACCA: 61 units (ref 0–90)
ALCA: 60 U (ref 0–60)
AMCA: 159 U — AB (ref 0–100)
Atypical pANCA: NEGATIVE
GASCA: 26 U (ref 0–50)

## 2016-09-17 ENCOUNTER — Ambulatory Visit: Payer: 59

## 2016-09-19 ENCOUNTER — Ambulatory Visit
Admission: RE | Admit: 2016-09-19 | Discharge: 2016-09-19 | Disposition: A | Discharge status: Home / Self Care | Payer: 59 | Source: Ambulatory Visit | Attending: Gastroenterology | Admitting: Gastroenterology

## 2016-09-19 DIAGNOSIS — K633 Ulcer of intestine: Secondary | ICD-10-CM | POA: Insufficient documentation

## 2016-09-21 ENCOUNTER — Telehealth: Payer: Self-pay

## 2016-09-21 NOTE — Telephone Encounter (Signed)
-----   Message from [name], MD sent at 09/16/2016  1:35 PM EDT ----- Please have the patient come in for a follow up. 

## 2016-09-21 NOTE — Telephone Encounter (Signed)
Pt scheduled for a follow up appt to discuss lab and SMFT results.

## 2016-09-27 ENCOUNTER — Other Ambulatory Visit: Payer: Self-pay

## 2016-09-27 ENCOUNTER — Ambulatory Visit (INDEPENDENT_AMBULATORY_CARE_PROVIDER_SITE_OTHER): Payer: 59 | Admitting: Gastroenterology

## 2016-09-27 ENCOUNTER — Encounter: Payer: Self-pay | Admitting: Gastroenterology

## 2016-09-27 VITALS — BP 127/72 | HR 61 | Temp 97.9°F | Ht 69.0 in | Wt 195.0 lb

## 2016-09-27 DIAGNOSIS — K50019 Crohn's disease of small intestine with unspecified complications: Secondary | ICD-10-CM

## 2016-09-27 MED ORDER — BUDESONIDE 3 MG PO CPEP: 9.0000 mg | ORAL_CAPSULE | ORAL | 1 refills | Status: DC

## 2016-09-27 NOTE — Progress Notes (Signed)
Primary Care Physician: Patient, No Pcp Per  Primary Gastroenterologist:  Dr. [name] Deaun Rocha  Chief Complaint  Patient presents with  . Follow up lab results    HPI: Joseph ChimesJoshua Gamble is a 41 y.o. male here for follow-up after having a small bowel follow-through. The patient had a colonoscopy with terminal ileitis on biopsy and a small bowel follow-through that showed him to have mild ileitis. The patient also had an IBD panel that was suggestive of Crohn's disease although not diagnostic. The patient's main complaint is fatigue.  Current Outpatient Prescriptions  Medication Sig Dispense Refill  . atenolol (TENORMIN) 50 MG tablet     . doxycycline (MONODOX) 100 MG capsule TAKE 1 CAPSULE TWICE DAILY AS DIRECTED  0  . fluticasone (FLONASE) 50 MCG/ACT nasal spray     . hydrocortisone (CORTENEMA) 100 MG/60ML enema I 1 ENEMA REC Q 12 H  0  . montelukast (SINGULAIR) 10 MG tablet Take 10 mg by mouth at bedtime.    . Multiple Vitamins-Minerals (MULTIVITAMIN ADULT PO) Take by mouth.    . naproxen sodium (ANAPROX) 220 MG tablet Take by mouth.    . vitamin C (ASCORBIC ACID) 500 MG tablet Take 500 mg by mouth daily.     No current facility-administered medications for this visit.     Allergies as of 09/27/2016 - Review Complete 09/27/2016  Allergen Reaction Noted  . Septra [sulfamethoxazole-trimethoprim] Hives and Rash 01/21/2009    ROS:  General: Negative for anorexia, weight loss, fever, chills, fatigue, weakness. ENT: Negative for hoarseness, difficulty swallowing , nasal congestion. CV: Negative for chest pain, angina, palpitations, dyspnea on exertion, peripheral edema.  Respiratory: Negative for dyspnea at rest, dyspnea on exertion, cough, sputum, wheezing.  GI: See history of present illness. GU:  Negative for dysuria, hematuria, urinary incontinence, urinary frequency, nocturnal urination.  Endo: Negative for unusual weight change.    Physical Examination:   BP 127/72   Pulse  61   Temp 97.9 F (36.6 C) (Oral)   Ht 5\' 9"  (1.753 m)   Wt 195 lb (88.5 kg)   BMI 28.80 kg/m   General: Well-nourished, well-developed in no acute distress.  Eyes: No icterus. Conjunctivae pink. Neuro: Alert and oriented x 3.  Grossly intact. Skin: Warm and dry, no jaundice.   Psych: Alert and cooperative, normal mood and affect.  Labs:    Imaging Studies: Dg Small Bowel  Result Date: 09/19/2016 CLINICAL DATA:  Intestinal ulceration. EXAM: SMALL BOWEL SERIES COMPARISON:  CT 07/18/2016. TECHNIQUE: Following ingestion of thin barium, serial small bowel images were obtained including spot views of the terminal ileum. FLUOROSCOPY TIME:  Fluoroscopy Time:  0 minutes 48 seconds Radiation Exposure Index (if provided by the fluoroscopic device): 18.8 mGy Number of Acquired Spot Images: 7 FINDINGS: Scout film reveals right nephrolithiasis. Pelvic calcifications noted consistent phleboliths. Barium was introduced orally and multiple overhead images and spot images obtained. Minimal mucosal prominence of the a distal most aspect of the terminal ileum cannot be excluded. The small bowel otherwise appears normal. Full thickness and caliber normal. Transit time normal. IMPRESSION: Minimal questionable mucosal thickening of the distal most aspect of the terminal ileum. Very mild ileitis cannot be completely excluded. Exam is otherwise negative . Electronically Signed   By: Maisie Fushomas  Register   On: 09/19/2016 10:33    Assessment and Plan:   Joseph ChimesJoshua Gamble is a 41 y.o. y/o male with a positive blood test suggestive of Crohn's disease and a biopsy of the terminal ileum suggestive  of Crohn's disease. The small bowel follow-through was also suggestive of ileitis without any disease anywhere else seen. The patient will be started on budesonide 9 mg a day. The patient will be given this for 2 months and will be set up to see Dr. Allegra Lai when she starts. The patient has been explained the plan and agrees with  it.    Midge Minium, MD. Clementeen Graham   Note: This dictation was prepared with Dragon dictation along with smaller phrase technology. Any transcriptional errors that result from this process are unintentional.

## 2016-11-26 ENCOUNTER — Telehealth: Payer: Self-pay | Admitting: Gastroenterology

## 2016-11-26 NOTE — Telephone Encounter (Signed)
I have patient scheduled with Dr. Allegra LaiVanga but he will run out of his medication. Please call patient to find out what it is. He was driving

## 2016-11-27 ENCOUNTER — Other Ambulatory Visit: Payer: Self-pay

## 2016-11-27 MED ORDER — BUDESONIDE 3 MG PO CPEP: 9.0000 mg | ORAL_CAPSULE | ORAL | 1 refills | Status: DC

## 2016-11-27 NOTE — Telephone Encounter (Signed)
Left vm letting pt know I have refilled is medication until his appt with Dr. Allegra LaiVanga on 12/25/16.

## 2016-12-20 ENCOUNTER — Encounter (INDEPENDENT_AMBULATORY_CARE_PROVIDER_SITE_OTHER): Payer: Self-pay

## 2016-12-20 ENCOUNTER — Encounter: Payer: Self-pay | Admitting: Gastroenterology

## 2016-12-20 ENCOUNTER — Other Ambulatory Visit
Admission: RE | Admit: 2016-12-20 | Discharge: 2016-12-20 | Disposition: A | Discharge status: Home / Self Care | Payer: 59 | Source: Ambulatory Visit | Attending: Gastroenterology | Admitting: Gastroenterology

## 2016-12-20 ENCOUNTER — Ambulatory Visit (INDEPENDENT_AMBULATORY_CARE_PROVIDER_SITE_OTHER): Payer: 59 | Admitting: Gastroenterology

## 2016-12-20 VITALS — BP 145/90 | HR 74 | Temp 98.9°F | Ht 69.0 in | Wt 193.2 lb

## 2016-12-20 DIAGNOSIS — K5000 Crohn's disease of small intestine without complications: Secondary | ICD-10-CM

## 2016-12-20 LAB — URINALYSIS, COMPLETE (UACMP) WITH MICROSCOPIC
BILIRUBIN URINE: NEGATIVE
Bacteria, UA: NONE SEEN
GLUCOSE, UA: NEGATIVE mg/dL
Hgb urine dipstick: NEGATIVE
KETONES UR: NEGATIVE mg/dL
LEUKOCYTES UA: NEGATIVE
Nitrite: NEGATIVE
PH: 6 (ref 5.0–8.0)
PROTEIN: NEGATIVE mg/dL
SPECIFIC GRAVITY, URINE: 1.013 (ref 1.005–1.030)

## 2016-12-20 LAB — C-REACTIVE PROTEIN: CRP: 1.3 mg/dL — ABNORMAL HIGH (ref <1.0)

## 2016-12-20 MED ORDER — MESALAMINE 1.2 G PO TBEC: 2.4000 g | DELAYED_RELEASE_TABLET | Freq: Every day | ORAL | 2 refills | Status: DC

## 2016-12-20 NOTE — Progress Notes (Signed)
Arlyss Repress, MD [address]  [address]  [place], [place] [address]  Main: [phone]  Fax: [phone]    Gastroenterology Consultation  Referring Provider:     No ref. provider found Primary Care Physician:  Patient, No Pcp Per Primary Gastroenterologist:  Dr. Lannette Donath Reason for Consultation:    Terminal ileitis        HPI:   Joseph Gamble is a 40 y.o. y/o male referred by Dr. Midge Minium for consultation & management of terminal ileitis, biopsy proven. His initial presentation was in 07/2015 with painless rectal bleeding In [place], [place]. Colonoscopy at that time revealed mild Erythema in rectum and histology showed mild chronic proctitis. He was not on any medications. His later presented to the [facility] ER on 07/17/2016 with severe diffuse abdominal cramping pain and nonbloody bowel movements. He reports having blood on wiping only. Prior to this episode he went on vacation, he was eating outside food and consuming alcohol quite a bit. He underwent a CT scan of the abdomen which demonstrated diffuse colitis of the ascending ,right , transverse colon as well as the terminal ileum. He underwent colonoscopy, revealed solitary ulcer in the terminal ileum, mildly inflamed sigmoid. Biopsies revealed active ileitis with no chronicity. And colon biopsies were normal. He was started on budesonide 3 mg 3 pills daily by Dr. Servando Snare. He is currently on this medication. He reports that the blood on wiping has resolved since then. Currently, he continues to have one to 2 formed bowel movements daily, gurgling in his abdomen, discomfort more like cramps. He denies urgency, nocturnal symptoms, nausea, vomiting, weight loss, fever. He finds his work to be quite stressful. He denies any upper GI symptoms  He was taking Aleve 4 pills every 12hrs about 3-4weeks ago when he heard he his back, took for 2weeks. He reports having rectal bleeding when he was on  Aleve. Previously took NSAIDs every day for 3 years, 5-6 yrs ago after he had back surgery for disc prolapse.  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 Lissa Hoard The appendiceal orifice was clearly visualized and the terminal ileum was very briefly intubated and the mucosa appeared to be visually normal.  Upon withdrawal of the scope, the mucosa of the cecum, ascending, transverse, descending, and sigmoid colon was all visually normal.  Multiple random biopsies were taken from the right colon and the left colon and submitted separately for microscopic evaluation.  The rectum displayed a grade 2-3 inflammatory reaction which was purely mucosal.  There were no deep or punched-out or rake ulcers.  There is a loss of vascular pattern throughout the rectum.  Multiple biopsies were taken. Path :Large intestine, right colon, endoscopic biopsy: - No histopathologic abnormality.  Large intestine, left colon, endoscopic biopsy: - No histopathologic abnormality.  Large intestine, rectum, endoscopic biopsy: - Patchy acute proctitis with cryptitis and mild features of chronicity. - Negative for granulomas or dysplasia.  Colonoscopy  08/17/2016 by Dr. Servando Snare The perianal and digital rectal examinations were normal. Findings: The terminal ileum contained a single (solitary) ulcer. No bleeding was present. Biopsies were taken with a cold forceps for histology. A localized area of mildly erythematous mucosa was found in the sigmoid colon. Random biopsies were obtained  in the entire colon with cold forceps for histology. Pathology: Terminal ileum: Mildly active colitis, negative for dysplasia, pyloric Metaplasia and granuloma Right colon biopsy-colonic mucosa with no  significant pathologic changes. Left colon biopsy-colonic mucosa with no significant pathologic changes. Rectosigmoid: Colonic mucosa with no significant pathologic changes.  Past Medical History:  Diagnosis Date  . Colon polyp   . Hx of seasonal allergies   . Hypertension     Past Surgical History:  Procedure Laterality Date  . BACK SURGERY     ? 2013  . COLONOSCOPY WITH PROPOFOL N/A 08/17/2016   Procedure: COLONOSCOPY WITH PROPOFOL;  Surgeon: Midge Minium, MD;  [facility];  Service: Endoscopy;  Laterality: N/A;  . CYST REMOVAL NECK    . NASAL SINUS SURGERY      Prior to Admission medications   Medication Sig Start Date End Date Taking? Authorizing Provider  atenolol (TENORMIN) 50 MG tablet  07/21/16   [provider]  budesonide (ENTOCORT EC) 3 MG 24 hr capsule Take 3 capsules (9 mg total) by mouth every morning. 11/27/16   Midge Minium, MD  doxycycline (MONODOX) 100 MG capsule TAKE 1 CAPSULE TWICE DAILY AS DIRECTED 09/22/16   [provider]  fluticasone Aleda Grana) 50 MCG/ACT nasal spray  07/02/16   [provider]  hydrocortisone (CORTENEMA) 100 MG/60ML enema I 1 ENEMA REC Q 12 H 07/23/16   [provider]  montelukast (SINGULAIR) 10 MG tablet Take 10 mg by mouth at bedtime.    [provider]  Multiple Vitamins-Minerals (MULTIVITAMIN ADULT PO) Take by mouth.    [provider]  naproxen sodium (ANAPROX) 220 MG tablet Take by mouth.    [provider]  vitamin C (ASCORBIC ACID) 500 MG tablet Take 500 mg by mouth daily.    [provider]    No family history on file.   Social History  Substance Use Topics  . Smoking status: Never Smoker  . Smokeless tobacco: Never Used     Comment: quit smoking traditional cigs 2015, E-cig 1x/day  . Alcohol use 5.4 oz/week    9 Glasses of wine per week     Comment:      Allergies as of 12/20/2016 - Review Complete 12/20/2016  Allergen Reaction Noted  .  Septra [sulfamethoxazole-trimethoprim] Hives and Rash 01/21/2009    Review of Systems:    All systems reviewed and negative except where noted in HPI.   Physical Exam:  BP (!) 145/90   Pulse 74   Temp 98.9 F (37.2 C) (Oral)   Ht  (1.753 m)   Wt 193 lb 3.2 oz (87.6 kg)   BMI 28.53 kg/m  No LMP for male patient.  General:   Alert,  Well-developed, well-nourished, pleasant and cooperative in NAD Head:  Normocephalic and atraumatic. Eyes:  Sclera clear, no icterus.   Conjunctiva pink. Ears:  Normal auditory acuity. Nose:  No deformity, discharge, or lesions. Mouth:  No deformity or lesions,oropharynx pink & moist. Neck:  Supple; no masses or thyromegaly. Lungs:  Respirations even and unlabored.  Clear throughout to auscultation.   No wheezes, crackles, or rhonchi. No acute distress. Heart:  Regular rate and rhythm; no murmurs, clicks, rubs, or gallops. Abdomen:  Normal bowel sounds.  No bruits.  Soft, non-tender and non-distended without masses, hepatosplenomegaly or hernias noted.  No guarding or rebound tenderness.   Rectal: Nor performed Msk:  Symmetrical without gross deformities. Good, equal movement & strength bilaterally. Pulses:  Normal pulses  noted. Extremities:  No clubbing or edema.  No cyanosis. Neurologic:  Alert and oriented x3;  grossly normal neurologically. Skin:  Intact without significant lesions or rashes. No jaundice. Lymph Nodes:  No significant cervical adenopathy. Psych:  Alert and cooperative. Normal mood and affect.  Imaging Studies: SBFT 09/19/2016 IMPRESSION: Minimal questionable mucosal thickening of the distal most aspect of the terminal ileum. Very mild ileitis cannot be completely excluded. Exam is otherwise negative .  CT A/P with contrast 07/18/2016 Stomach/Bowel: Mild gastric distention. Normal appendix. Moderate diffuse wall thickening of the colon greatest in the right transverse and ascending segments compatible with  colitis. Additionally, there is mild wall thickening of the terminal ileum (series 5, image 39). Small bowel is otherwise unremarkable. Assessment and Plan:   Joseph Gamble is a 41 y.o. y/o male with History of heavy NSAID use, presents with diffuse abdominal cramping pain, intermittent rectal bleeding only on wiping. His histology findings from rectal biopsies and terminal ileal biopsies do not confirm the presence of Crohn's disease. He may have de novo/early Crohn's disease. I recommend to stop Entocort, start mesalamine 2.4 g daily, check CRP, check urinalysis. I recommended him to continue mesalamine at least for 6 months to a year and then repeat colonoscopy or sooner if his symptoms get worse. I also suspect that his GI symptoms are predominantly functional given the amount of stress he is going through.  Follow up in 3-4 months   Arlyss Repressohini R Zylon Creamer, MD

## 2016-12-25 ENCOUNTER — Ambulatory Visit: Payer: 59 | Admitting: Gastroenterology

## 2016-12-26 ENCOUNTER — Ambulatory Visit: Payer: 59 | Admitting: Gastroenterology

## 2017-01-02 ENCOUNTER — Telehealth: Payer: Self-pay

## 2017-01-02 NOTE — Telephone Encounter (Signed)
Patient has been informed his CRP is mildly elevated suggests inflammation, recommend increasing mesalamine to 2.4 g 2 times a day.   Thanks [employer]

## 2017-01-11 ENCOUNTER — Telehealth: Payer: Self-pay | Admitting: Gastroenterology

## 2017-01-11 NOTE — Telephone Encounter (Signed)
*  STAT* If patient is at the pharmacy, call can be transferred to refill team.   1. Which medications need to be refilled? (please list name of each medication and dose if known) Lialda 1.2   2. Which pharmacy/location (including street and city if local pharmacy) is medication to be sent to? [facility]  3. Do they need a 30 day or 90 day supply? 90 day  Wants a new Rx called in with new directions.

## 2017-01-14 ENCOUNTER — Other Ambulatory Visit: Payer: Self-pay

## 2017-01-14 MED ORDER — MESALAMINE 1.2 G PO TBEC: 2.4000 g | DELAYED_RELEASE_TABLET | Freq: Every day | ORAL | 2 refills | Status: DC

## 2017-01-21 ENCOUNTER — Telehealth: Payer: Self-pay | Admitting: Gastroenterology

## 2017-01-21 DIAGNOSIS — K648 Other hemorrhoids: Secondary | ICD-10-CM

## 2017-01-21 MED ORDER — LIDOCAINE (ANORECTAL) 5 % EX CREA: TOPICAL_CREAM | CUTANEOUS | 2 refills | Status: AC

## 2017-01-21 MED ORDER — PRAMOXINE HCL 1 % RE FOAM: 1.0000 "application " | Freq: Four times a day (QID) | RECTAL | 1 refills | Status: AC | PRN

## 2017-01-21 NOTE — Telephone Encounter (Signed)
He reports having worsening of low back pain after increasing the dose of Lialda to 4 pills daily. He reports that the back pain started a week after he started taking Lialda 2 pills a day and and when he ran out of the medication for about 4-5 days, the back pain improved. He filled in 90 day supply just about a week ago and then he started taking 4.8 g daily, his back pain recurred. He stopped Lialda currently and started taking budesonide 3 pills daily. He reports having formed stools. Denies abdominal cramps but continues to have perianal irritation, blood mostly on wiping sometimes mixed with stool. The symptoms are most likely secondary to hemorrhoids. He does have history of mild proctitis in the past  He has been undergoing physical therapy for low back pain and also biking during this period. He has background of chronic low back pain for which he underwent surgery.  Recommendations - Restart Lialda at 1.2 g daily and increase to 2.4 g if he tolerates - We will try Proctofoam and topical lidocaine for his perianal/rectal symptoms - We will check CRP during next visit - Follow-up in 6-8 weeks  Arlyss Repress, MD [address]  [address]  [place], [place] [address]  Main: [phone]  Fax: [phone] Pager: [phone]

## 2017-01-21 NOTE — Telephone Encounter (Signed)
Patient LVM and is having problems with his medication and needs help, please call him.

## 2017-01-21 NOTE — Telephone Encounter (Signed)
Returned patients call to discuss Lialda- he is experiencing "major back pain since starting Lialda".  He read this is one of the side effects.  Once he stopped the medication the pain stopped.  Please advise on a different medication.  Pt also stated that he just paid $90 for his mail order rx for this medication.  Please advise  Thanks Marcelino Duster

## 2017-01-21 NOTE — Telephone Encounter (Signed)
Please call patient regarding Joseph Gamble.

## 2017-03-15 ENCOUNTER — Other Ambulatory Visit: Payer: Self-pay | Admitting: Gastroenterology

## 2017-04-23 ENCOUNTER — Telehealth: Payer: Self-pay | Admitting: Gastroenterology

## 2017-04-23 NOTE — Telephone Encounter (Signed)
Patient feels the medication isn't working for his hemorrhoids and would like something topical, please call patient.

## 2017-05-07 ENCOUNTER — Telehealth: Payer: Self-pay | Admitting: Gastroenterology

## 2017-05-07 NOTE — Telephone Encounter (Signed)
Patient stated he is still waiting on a call from last week to discuss the medication that isn't working for him. Please call him today.

## 2017-05-13 ENCOUNTER — Encounter: Payer: Self-pay | Admitting: Gastroenterology

## 2017-05-13 ENCOUNTER — Ambulatory Visit: Payer: 59 | Admitting: Gastroenterology

## 2017-05-13 ENCOUNTER — Encounter (INDEPENDENT_AMBULATORY_CARE_PROVIDER_SITE_OTHER): Payer: Self-pay

## 2017-05-13 VITALS — BP 124/86 | HR 67 | Temp 98.1°F | Ht 69.0 in | Wt 193.6 lb

## 2017-05-13 DIAGNOSIS — K625 Hemorrhage of anus and rectum: Secondary | ICD-10-CM | POA: Diagnosis not present

## 2017-05-13 DIAGNOSIS — K641 Second degree hemorrhoids: Secondary | ICD-10-CM | POA: Diagnosis not present

## 2017-05-13 MED ORDER — CLOTRIMAZOLE-BETAMETHASONE 1-0.05 % EX CREA: 1.0000 "application " | TOPICAL_CREAM | Freq: Two times a day (BID) | CUTANEOUS | 0 refills | Status: DC

## 2017-05-13 NOTE — Progress Notes (Signed)
Arlyss Repress, MD [address]  [address]  [place], [place] [address]  Main: [phone]  Fax: [phone]    Gastroenterology Consultation  Referring Provider:     No ref. provider found Primary Care Physician:  Patient, No Pcp Per Primary Gastroenterologist:  Dr. Lannette Donath Reason for Consultation:    Terminal ileitis, symptomatic hemorrhoids        HPI:   Joseph Gamble is a 42 y.o. Caucasian male referred by Dr. Midge Minium for consultation & management of terminal ileitis, biopsy proven and symptomatic hemorrhoids. His initial presentation was in 07/2015 with painless rectal bleeding In [place], [place]. Colonoscopy at that time revealed mild Erythema in rectum and histology showed mild chronic proctitis. He was not on any medications. His later presented to the [facility] ER on 07/17/2016 with severe diffuse abdominal cramping pain and nonbloody bowel movements. He reports having blood on wiping only. Prior to this episode he went on vacation, he was eating outside food and consuming alcohol quite a bit. He underwent a CT scan of the abdomen which demonstrated diffuse colitis of the ascending ,right , transverse colon as well as the terminal ileum. He underwent colonoscopy, revealed solitary ulcer in the terminal ileum, mildly inflamed sigmoid. Biopsies revealed active ileitis with no chronicity. And colon biopsies were normal. He was started on budesonide 3 mg 3 pills daily by Dr. Servando Snare. He is currently on this medication. He reports that the blood on wiping has resolved since then. Currently, he continues to have one to 2 formed bowel movements daily, gurgling in his abdomen, discomfort more like cramps. He denies urgency, nocturnal symptoms, nausea, vomiting, weight loss, fever. He finds his work to be quite stressful. He denies any upper GI symptoms  He was taking Aleve 4 pills every 12hrs about 3-4weeks ago when he heard he his back, took for  2weeks. He reports having rectal bleeding when he was on Aleve. Previously took NSAIDs every day for 3 years, 5-6 yrs ago after he had back surgery for disc prolapse.  Follow-up visit 05/13/2017: Since last visit, I started him on Lialda 2.4 g daily. He developed back pain on Lialda 4.8 g daily, felt better after reducing it to 2.4 g daily. His GI symptoms also got significantly better. He ran out of prescription about a week ago and he feels like his symptoms have returned, predominantly gas and cramps. He is also being bothered by hemorrhoids, has rectal bleeding as well as itching. He denies constipation. He is not taking NSAIDs anymore. He stopped taking budesonide as well.  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 Lissa Hoard The appendiceal orifice was clearly visualized and the terminal ileum was very briefly intubated and the mucosa appeared to be visually normal.  Upon withdrawal of the scope, the mucosa of the cecum, ascending, transverse, descending, and sigmoid colon was all visually normal.  Multiple random biopsies were taken from the right colon and the left colon and submitted separately for microscopic evaluation.  The rectum displayed a grade 2-3 inflammatory reaction which was purely mucosal.  There were no deep or punched-out or rake ulcers.  There is a loss of vascular pattern throughout the rectum.  Multiple biopsies were taken. Path :Large intestine, right  colon, endoscopic biopsy: - No histopathologic abnormality.  Large intestine, left colon, endoscopic biopsy: - No histopathologic abnormality.  Large intestine, rectum, endoscopic biopsy: - Patchy acute proctitis with cryptitis and mild features of chronicity. - Negative for granulomas  or dysplasia.  Colonoscopy  08/17/2016 by Dr. Servando Snare The perianal and digital rectal examinations were normal. Findings: The terminal ileum contained a single (solitary) ulcer. No bleeding was present. Biopsies were taken with a cold forceps for histology. A localized area of mildly erythematous mucosa was found in the sigmoid colon. Random biopsies were obtained in the entire colon with cold forceps for histology. Pathology: Terminal ileum: Mildly active colitis, negative for dysplasia, pyloric Metaplasia and granuloma Right colon biopsy-colonic mucosa with no significant pathologic changes. Left colon biopsy-colonic mucosa with no significant pathologic changes. Rectosigmoid: Colonic mucosa with no significant pathologic changes.  Past Medical History:  Diagnosis Date  . Colon polyp   . Hx of seasonal allergies   . Hypertension     Past Surgical History:  Procedure Laterality Date  . BACK SURGERY     ? 2013  . COLONOSCOPY WITH PROPOFOL N/A 08/17/2016   Procedure: COLONOSCOPY WITH PROPOFOL;  Surgeon: Midge Minium, MD;  Location: [facility];  Service: Endoscopy;  Laterality: N/A;  . CYST REMOVAL NECK    . NASAL SINUS SURGERY       Current Outpatient Medications:  .  atenolol (TENORMIN) 50 MG tablet, , Disp: , Rfl:  .  budesonide (ENTOCORT EC) 3 MG 24 hr capsule, TAKE 3 CAPSULES (9 MG TOTAL) BY MOUTH EVERY MORNING., Disp: 90 capsule, Rfl: 1 .  montelukast (SINGULAIR) 10 MG tablet, Take 10 mg by mouth at bedtime., Disp: , Rfl:  .  Multiple Vitamins-Minerals (MULTIVITAMIN ADULT PO), Take by mouth., Disp: , Rfl:  .  vitamin C (ASCORBIC ACID) 500 MG tablet, Take 500 mg by mouth daily., Disp: , Rfl:  .  doxycycline (MONODOX) 100 MG capsule, TAKE 1 CAPSULE TWICE DAILY AS DIRECTED, Disp: , Rfl: 0 .  fluticasone (FLONASE) 50 MCG/ACT nasal spray, , Disp: , Rfl:  .  hydrocortisone (CORTENEMA) 100 MG/60ML enema, I 1 ENEMA REC Q 12 H, Disp: , Rfl: 0 .  Lidocaine, Anorectal, 5 % CREA,  Apply to affected area up to 6 times daily (Patient not taking: Reported on 05/13/2017), Disp: 1 Tube, Rfl: 2 .  mesalamine (LIALDA) 1.2 g EC tablet, Take 2 tablets (2.4 g total) by mouth daily., Disp: 60 tablet, Rfl: 2 .  naproxen sodium (ANAPROX) 220 MG tablet, Take by mouth., Disp: , Rfl:    No family history on file.   Social History   Tobacco Use  . Smoking status: Never Smoker  . Smokeless tobacco: Never Used  . Tobacco comment: quit smoking traditional cigs 2015, E-cig 1x/day  Substance Use Topics  . Alcohol use: Yes    Alcohol/week: 5.4 oz    Types: 9 Glasses of wine per week    Comment:    . Drug use: No    Allergies as of 05/13/2017 - Review Complete 05/13/2017  Allergen Reaction Noted  . Septra [sulfamethoxazole-trimethoprim] Hives and Rash 01/21/2009    Review of Systems:    All systems reviewed and negative except where noted in HPI.   Physical Exam:  BP 124/86   Pulse 67   Temp 98.1 F (36.7 C) (Oral)   Ht 5\' 9"  (1.753 m)   Wt 193 lb 9.6 oz (87.8 kg)   BMI 28.59 kg/m  No LMP for  male patient.  General:   Alert,  Well-developed, well-nourished, pleasant and cooperative in NAD Head:  Normocephalic and atraumatic. Eyes:  Sclera clear, no icterus.   Conjunctiva pink. Ears:  Normal auditory acuity. Nose:  No deformity, discharge, or lesions. Mouth:  No deformity or lesions,oropharynx pink & moist. Neck:  Supple; no masses or thyromegaly. Lungs:  Respirations even and unlabored.  Clear throughout to auscultation.   No wheezes, crackles, or rhonchi. No acute distress. Heart:  Regular rate and rhythm; no murmurs, clicks, rubs, or gallops. Abdomen:  Normal bowel sounds.  No bruits.  Soft, non-tender and non-distended without masses, hepatosplenomegaly or hernias noted.  No guarding or rebound tenderness.   Rectal: Nor performed Msk:  Symmetrical without gross deformities. Good, equal movement & strength bilaterally. Pulses:  Normal pulses noted. Extremities:   No clubbing or edema.  No cyanosis. Neurologic:  Alert and oriented x3;  grossly normal neurologically. Skin:  Intact without significant lesions or rashes. No jaundice. Lymph Nodes:  No significant cervical adenopathy. Psych:  Alert and cooperative. Normal mood and affect.  Imaging Studies: SBFT 09/19/2016 IMPRESSION: Minimal questionable mucosal thickening of the distal most aspect of the terminal ileum. Very mild ileitis cannot be completely excluded. Exam is otherwise negative .  CT A/P with contrast 07/18/2016 Stomach/Bowel: Mild gastric distention. Normal appendix. Moderate diffuse wall thickening of the colon greatest in the right transverse and ascending segments compatible with colitis. Additionally, there is mild wall thickening of the terminal ileum (series 5, image 39). Small bowel is otherwise unremarkable. Assessment and Plan:   Joseph Gamble is a 42 y.o. Caucasian male with History of heavy NSAID use, presents for follow-up of diffuse abdominal cramping pain, symptomatic hemorrhoids. His histology findings from rectal biopsies and terminal ileal biopsies do not confirm the presence of Crohn's disease. Terminal ileitis could be related to NSAID-induced injury. Or, he may have de novo/early Crohn's disease.   Abdominal cramps/gas: Continue Lialda 2.4 g daily I recommended him to continue mesalamine at least for 6 months to a year and then repeat colonoscopy or sooner if his symptoms get worse. I discussed with him about empiric trial of rifaximin mean for 2 weeks and small bowel capsule endoscopy. Patient would like to give some more time, wait for his hemorrhoids to be taken care of before pursuing further evaluation I also suspect that his GI symptoms are predominantly functional given the amount of stress he is going through  Symptomatic hemorrhoids: - I have discussed with him about outpatient hemorrhoid ligation and he would like to schedule it for some other day -He  will continue hydrocortisone cream twice daily as well as Lotrisone cream for perianal itching  Follow up in 2 weeks   Arlyss Repressohini R Berel Najjar, MD

## 2017-05-15 ENCOUNTER — Other Ambulatory Visit: Payer: Self-pay

## 2017-05-15 ENCOUNTER — Other Ambulatory Visit: Payer: Self-pay | Admitting: Gastroenterology

## 2017-05-15 ENCOUNTER — Telehealth: Payer: Self-pay | Admitting: Gastroenterology

## 2017-05-15 MED ORDER — CLOTRIMAZOLE-BETAMETHASONE 1-0.05 % EX CREA: 1.0000 "application " | TOPICAL_CREAM | Freq: Two times a day (BID) | CUTANEOUS | 0 refills | Status: AC

## 2017-05-15 NOTE — Telephone Encounter (Signed)
*  STAT* If patient is at the pharmacy, call can be transferred to refill team.   1. Which medications need to be refilled? (please list name of each medication and dose if known) Lialda 1.2 g  2. Which pharmacy/location (including street and city if local pharmacy) is medication to be sent to? [facility]  3. Do they need a 30 day or 90 day supply? 90  Patient called and stated he need a prior auth on this ASAP.

## 2017-05-31 ENCOUNTER — Ambulatory Visit: Payer: 59 | Admitting: Gastroenterology

## 2017-05-31 ENCOUNTER — Encounter: Payer: Self-pay | Admitting: Gastroenterology

## 2017-05-31 VITALS — BP 128/81 | HR 71 | Temp 98.0°F | Wt 196.6 lb

## 2017-05-31 DIAGNOSIS — K644 Residual hemorrhoidal skin tags: Secondary | ICD-10-CM

## 2017-05-31 DIAGNOSIS — K640 First degree hemorrhoids: Secondary | ICD-10-CM

## 2017-05-31 MED ORDER — HYDROCORTISONE 2.5 % RE CREA: 1.0000 "application " | TOPICAL_CREAM | Freq: Two times a day (BID) | RECTAL | 1 refills | Status: AC

## 2017-05-31 MED ORDER — NONFORMULARY OR COMPOUNDED ITEM: 1 refills | Status: AC

## 2017-05-31 MED ORDER — CLOTRIMAZOLE-BETAMETHASONE 1-0.05 % EX CREA: 1.0000 "application " | TOPICAL_CREAM | Freq: Two times a day (BID) | CUTANEOUS | 0 refills | Status: AC

## 2017-05-31 NOTE — Progress Notes (Signed)
Joseph Repressohini R Cashel Bellina, MD [address]  [address]  [place], [place] [address]  Main: [phone]  Fax: [phone]    Gastroenterology Consultation  Referring Provider:     No ref. provider found Primary Care Physician:  Patient, No Pcp Per Primary Gastroenterologist:  Dr. Lannette Donathohini Breland Elders Reason for Consultation:    Terminal ileitis, symptomatic hemorrhoids        HPI:   Joseph Gamble is a 42 y.o. Caucasian male referred by Dr. [name] Wohl for consultation & management of terminal ileitis, biopsy proven and symptomatic hemorrhoids. His initial presentation was in 07/2015 [place]. Colonoscopy at that time revealed mild Erythema in rectum and histology showed mild chronic proctitis. He was not on any medications. His later presented to [facility] on 07/17/2016 with severe diffuse abdominal cramping pain and nonbloody bowel movements. He reports having blood on wiping only. Prior to this episode he went on vacation, he was eating outside food and consuming alcohol quite a bit. He underwent a CT scan of the abdomen which demonstrated diffuse colitis of the ascending ,right , transverse colon as well as the terminal ileum. He underwent colonoscopy, revealed solitary ulcer in the terminal ileum, mildly inflamed sigmoid. Biopsies revealed active ileitis with no chronicity. And colon biopsies were normal. He was started on budesonide 3 mg 3 pills daily by Dr. Servando SnareWohl. He is currently on this medication. He reports that the blood on wiping has resolved since then. Currently, he continues to have one to 2 formed bowel movements daily, gurgling in his abdomen, discomfort more like cramps. He denies urgency, nocturnal symptoms, nausea, vomiting, weight loss, fever. He finds his work to be quite stressful. He denies any upper GI symptoms  He was taking Aleve 4 pills every 12hrs about 3-4weeks ago when he heard he his back, took for  2weeks. He reports having rectal bleeding when he was on Aleve. Previously took NSAIDs every day for 3 years, 5-6 yrs ago after he had back surgery for disc prolapse.  Follow-up visit 05/13/2017: Since last visit, I started him on Lialda 2.4 g daily. He developed back pain on Lialda 4.8 g daily, felt better after reducing it to 2.4 g daily. His GI symptoms also got significantly better. He ran out of prescription about a week ago and he feels like his symptoms have returned, predominantly gas and cramps. He is also being bothered by hemorrhoids, has rectal bleeding as well as itching. He denies constipation. He is not taking NSAIDs anymore. He stopped taking budesonide as well.  Follow-up visit 05/31/2017: Patient reports his hemorrhoid symptoms have improved significantly with topical therapies. He is interested to undergo outpatient hemorrhoid ligation today.  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 [place] The appendiceal orifice was clearly visualized and the terminal ileum was very briefly intubated and the mucosa appeared to be visually normal.  Upon withdrawal of the scope, the mucosa of the cecum, ascending, transverse, descending, and sigmoid colon was all visually normal.  Multiple random biopsies were taken from the right colon and the left colon and submitted separately for microscopic evaluation.  The rectum displayed a grade 2-3 inflammatory reaction which was purely mucosal.  There were no deep or  punched-out or rake ulcers.  There is a loss of vascular pattern throughout the rectum.  Multiple biopsies were taken. Path :Large intestine, right colon, endoscopic biopsy: - No histopathologic abnormality.  Large intestine, left colon, endoscopic  biopsy: - No histopathologic abnormality.  Large intestine, rectum, endoscopic biopsy: - Patchy acute proctitis with cryptitis and mild features of chronicity. - Negative for granulomas or dysplasia.  Colonoscopy  08/17/2016 by Dr. Servando Snare The perianal and digital rectal examinations were normal. Findings: The terminal ileum contained a single (solitary) ulcer. No bleeding was present. Biopsies were taken with a cold forceps for histology. A localized area of mildly erythematous mucosa was found in the sigmoid colon. Random biopsies were obtained in the entire colon with cold forceps for histology. Pathology: Terminal ileum: Mildly active colitis, negative for dysplasia, pyloric Metaplasia and granuloma Right colon biopsy-colonic mucosa with no significant pathologic changes. Left colon biopsy-colonic mucosa with no significant pathologic changes. Rectosigmoid: Colonic mucosa with no significant pathologic changes.  Past Medical History:  Diagnosis Date  . Colon polyp   . Hx of seasonal allergies   . Hypertension     Past Surgical History:  Procedure Laterality Date  . BACK SURGERY     ? 2013  . COLONOSCOPY WITH PROPOFOL N/A 08/17/2016   Procedure: COLONOSCOPY WITH PROPOFOL;  Surgeon: Midge Minium, MD;  Location: [facility];  Service: Endoscopy;  Laterality: N/A;  . CYST REMOVAL NECK    . NASAL SINUS SURGERY       Current Outpatient Medications:  .  atenolol (TENORMIN) 50 MG tablet, , Disp: , Rfl:  .  budesonide (ENTOCORT EC) 3 MG 24 hr capsule, TAKE 3 CAPSULES (9 MG TOTAL) BY MOUTH EVERY MORNING., Disp: 90 capsule, Rfl: 1 .  clotrimazole-betamethasone (LOTRISONE) cream, Apply 1 application topically 2 (two) times daily., Disp: 30 g, Rfl: 0 .  doxycycline (MONODOX) 100 MG capsule, TAKE 1 CAPSULE TWICE DAILY AS DIRECTED, Disp: , Rfl: 0 .  fluticasone (FLONASE) 50 MCG/ACT nasal spray, , Disp: , Rfl:  .  hydrocortisone (ANUSOL-HC) 2.5 % rectal cream, Place 1 application  rectally 2 (two) times daily., Disp: 30 g, Rfl: 1 .  hydrocortisone (CORTENEMA) 100 MG/60ML enema, I 1 ENEMA REC Q 12 H, Disp: , Rfl: 0 .  Lidocaine, Anorectal, 5 % CREA, Apply to affected area up to 6 times daily (Patient not taking: Reported on 05/13/2017), Disp: 1 Tube, Rfl: 2 .  mesalamine (LIALDA) 1.2 g EC tablet, Take 2 tablets (2.4 g total) by mouth daily., Disp: 60 tablet, Rfl: 0 .  montelukast (SINGULAIR) 10 MG tablet, Take 10 mg by mouth at bedtime., Disp: , Rfl:  .  Multiple Vitamins-Minerals (MULTIVITAMIN ADULT PO), Take by mouth., Disp: , Rfl:  .  naproxen sodium (ANAPROX) 220 MG tablet, Take by mouth., Disp: , Rfl:  .  NONFORMULARY OR COMPOUNDED ITEM, Nitroglycerin 0.125% with Lidocaine 2% compound ointment  Sig: Apply a pea size amount to the rectum as directed 3-4 times per day, Disp: 1 each, Rfl: 1 .  vitamin C (ASCORBIC ACID) 500 MG tablet, Take 500 mg by mouth daily., Disp: , Rfl:    No family history on file.   Social History   Tobacco Use  . Smoking status: Never Smoker  . Smokeless tobacco: Never Used  . Tobacco comment: quit smoking traditional cigs 2015, E-cig 1x/day  Substance Use Topics  . Alcohol use: Yes    Alcohol/week: 5.4 oz    Types: 9 Glasses of wine per week  Comment:    . Drug use: No    Allergies as of 05/31/2017 - Review Complete 05/13/2017  Allergen Reaction Noted  . Septra [sulfamethoxazole-trimethoprim] Hives and Rash 01/21/2009    Review of Systems:    All systems reviewed and negative except where noted in HPI.   Physical Exam:  BP 128/81   Pulse 71   Temp 98 F (36.7 C) (Oral)   Wt 196 lb 9.6 oz (89.2 kg)   BMI 29.03 kg/m  No LMP for male patient.  General:   Alert,  Well-developed, well-nourished, pleasant and cooperative in NAD Head:  Normocephalic and atraumatic. Eyes:  Sclera clear, no icterus.   Conjunctiva pink. Ears:  Normal auditory acuity. Nose:  No deformity, discharge, or lesions. Mouth:  No deformity or  lesions,oropharynx pink & moist. Neck:  Supple; no masses or thyromegaly. Lungs:  Respirations even and unlabored.  Clear throughout to auscultation.   No wheezes, crackles, or rhonchi. No acute distress. Heart:  Regular rate and rhythm; no murmurs, clicks, rubs, or gallops. Abdomen:  Normal bowel sounds.  No bruits.  Soft, non-tender and non-distended without masses, hepatosplenomegaly or hernias noted.  No guarding or rebound tenderness.   Rectal: Nor performed Msk:  Symmetrical without gross deformities. Good, equal movement & strength bilaterally. Pulses:  Normal pulses noted. Extremities:  No clubbing or edema.  No cyanosis. Neurologic:  Alert and oriented x3;  grossly normal neurologically. Skin:  Intact without significant lesions or rashes. No jaundice. Lymph Nodes:  No significant cervical adenopathy. Psych:  Alert and cooperative. Normal mood and affect.  Imaging Studies: SBFT 09/19/2016 IMPRESSION: Minimal questionable mucosal thickening of the distal most aspect of the terminal ileum. Very mild ileitis cannot be completely excluded. Exam is otherwise negative .  CT A/P with contrast 07/18/2016 Stomach/Bowel: Mild gastric distention. Normal appendix. Moderate diffuse wall thickening of the colon greatest in the right transverse and ascending segments compatible with colitis. Additionally, there is mild wall thickening of the terminal ileum (series 5, image 39). Small bowel is otherwise unremarkable. Assessment and Plan:   Joseph Gamble is a 42 y.o. Caucasian male with History of heavy NSAID use, presents for follow-up of diffuse abdominal cramping pain, symptomatic hemorrhoids. His histology findings from rectal biopsies and terminal ileal biopsies do not confirm the presence of Crohn's disease. Terminal ileitis could be related to NSAID-induced injury. Or, he may have de novo/early Crohn's disease.   Abdominal cramps/gas: Continue Lialda 2.4 g daily I recommended him to  continue mesalamine at least for 6 months to a year and then repeat colonoscopy or sooner if his symptoms get worse. I discussed with him about empiric trial of rifaximin mean for 2 weeks and small bowel capsule endoscopy. Patient would like to give some more time, wait for his hemorrhoids to be taken care of before pursuing further evaluation I also suspect that his GI symptoms are predominantly functional given the amount of stress he is going through  Symptomatic hemorrhoids: -I have discussed with him about outpatient hemorrhoid ligation and he would like to proceed with it today -He will continue hydrocortisone cream twice daily as well as Lotrisone cream for perianal itching  Follow up in 2 weeks   Joseph Repress, MD

## 2017-06-10 ENCOUNTER — Other Ambulatory Visit: Payer: Self-pay | Admitting: Gastroenterology

## 2017-06-14 ENCOUNTER — Encounter: Payer: Self-pay | Admitting: Gastroenterology

## 2017-06-14 ENCOUNTER — Ambulatory Visit: Payer: 59 | Admitting: Gastroenterology

## 2017-06-14 VITALS — BP 121/80 | HR 86 | Temp 99.4°F | Ht 69.0 in | Wt 193.0 lb

## 2017-06-14 DIAGNOSIS — K640 First degree hemorrhoids: Secondary | ICD-10-CM

## 2017-06-14 NOTE — Progress Notes (Signed)
PROCEDURE NOTE: The patient presents with symptomatic grade 1 hemorrhoids, unresponsive to maximal medical therapy, requesting rubber band ligation of his/her hemorrhoidal disease.  All risks, benefits and alternative forms of therapy were described and informed consent was obtained.  The decision was made to band the RP internal hemorrhoid, and the [facility] System was used to perform band ligation without complication.  Digital anorectal examination was then performed to assure proper positioning of the band, and to adjust the banded tissue as required.  The patient was discharged home without pain or other issues.  Dietary and behavioral recommendations were given and (if necessary - prescriptions were given), along with follow-up instructions.  The patient will return 2 weeks for follow-up and possible additional banding as required.  No complications were encountered and the patient tolerated the procedure well.  Cortlyn Cannell R Reeder Brisby, MD [address]  [address]  [place],  [address]  Main: [phone]  Fax: [phone] Pager: [phone]  

## 2017-07-03 ENCOUNTER — Encounter: Payer: Self-pay | Admitting: Gastroenterology

## 2017-07-03 ENCOUNTER — Ambulatory Visit: Payer: 59 | Admitting: Gastroenterology

## 2017-07-03 VITALS — BP 146/94 | HR 71 | Temp 98.1°F | Ht 69.0 in | Wt 193.4 lb

## 2017-07-03 DIAGNOSIS — K640 First degree hemorrhoids: Secondary | ICD-10-CM | POA: Diagnosis not present

## 2017-07-03 NOTE — Progress Notes (Signed)
PROCEDURE NOTE: The patient presents with symptomatic grade 1 hemorrhoids, unresponsive to maximal medical therapy, requesting rubber band ligation of his/her hemorrhoidal disease.  All risks, benefits and alternative forms of therapy were described and informed consent was obtained.  The decision was made to band the LL internal hemorrhoid, and the [facility] O'Reg[facility] System was used to perform band ligation without complication.  Digital anorectal examination was then performed to assure proper positioning of the band, and to adjust the banded tissue as required.  The patient was discharged home without pain or other issues.  Dietary and behavioral recommendations were given and (if necessary - prescriptions were given), along with follow-up instructions.  The patient will return  as needed for follow-up and possible additional banding as required.  No complications were encountered and the patient tolerated the procedure well.  Saadiq Poche R Satrina Magallanes, MD [address]  [address]  [place], [place] [address]  Main: [phone]  Fax: [phone] Pager: [phone]   

## 2017-08-03 ENCOUNTER — Other Ambulatory Visit: Payer: Self-pay | Admitting: Gastroenterology

## 2017-09-03 ENCOUNTER — Other Ambulatory Visit: Payer: Self-pay | Admitting: Gastroenterology

## 2017-10-14 ENCOUNTER — Other Ambulatory Visit: Payer: Self-pay | Admitting: Gastroenterology

## 2018-09-30 IMAGING — CT CT ABD-PELV W/ CM
2 of 4 series · 15 of 46 positions shown, 17 images · IV contrast (iopamidol)
Comparison: None.

CLINICAL DATA: 40 y/o  M; nausea and diarrhea with abdominal pain.

EXAM:
CT ABDOMEN AND PELVIS WITH CONTRAST
TECHNIQUE: Multidetector CT imaging of the abdomen and pelvis was performed
using the standard protocol following bolus administration of
intravenous contrast.
CONTRAST:  100mL SVLDCO-S22 IOPAMIDOL (SVLDCO-S22) INJECTION 61%

[Series 2: routine abd/pel with · axial · 0.80mm/px · z∈[-1147,-682]mm · 12 of 103 slices shown, 14 images]
[im 5/103  soft-tissue]
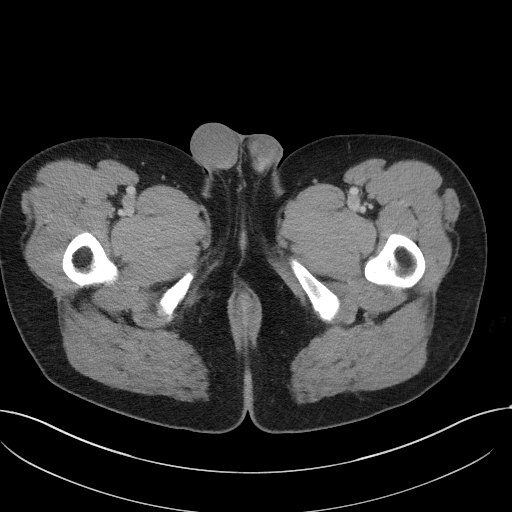
[im 5/103  bone]
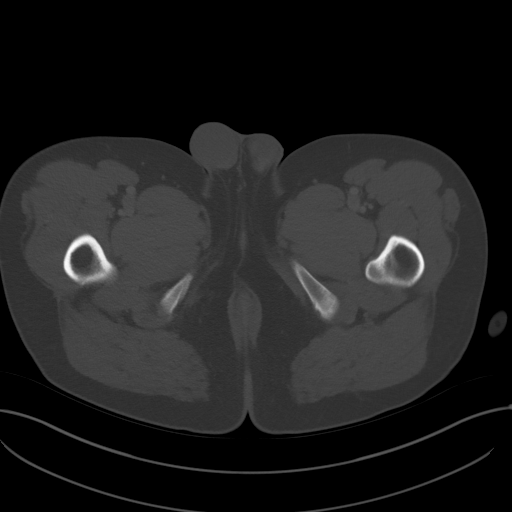
[im 13/103  soft-tissue]
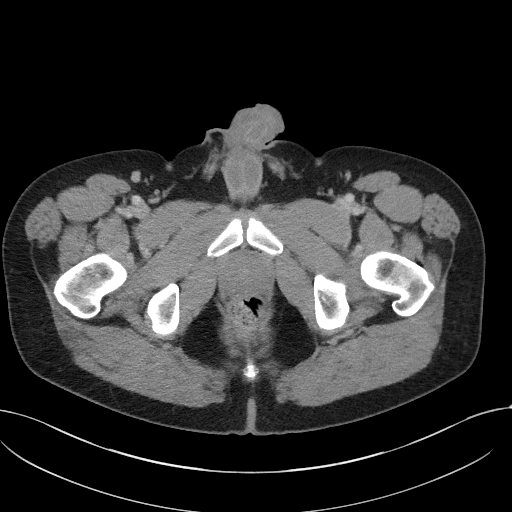
[im 22/103  soft-tissue]
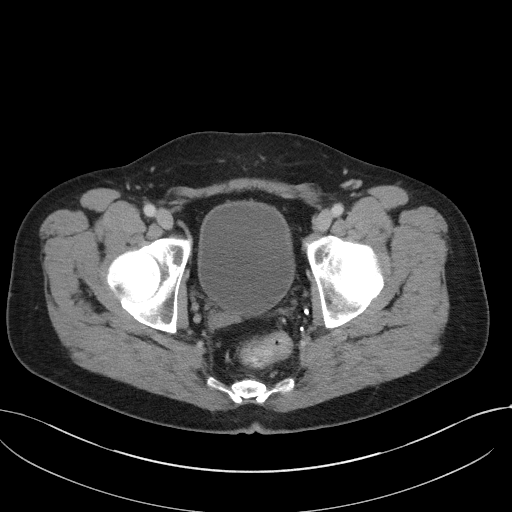
[im 30/103  soft-tissue]
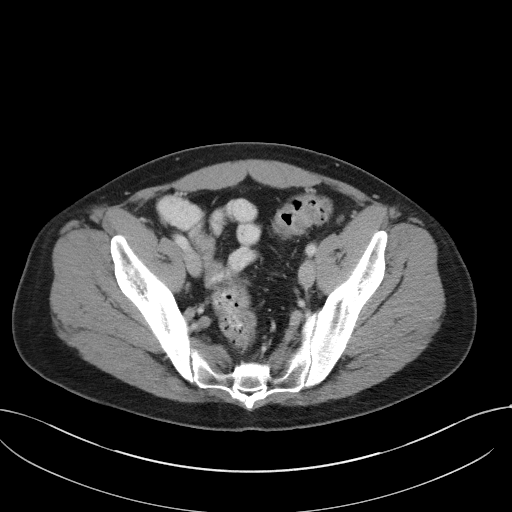
[im 39/103  soft-tissue]
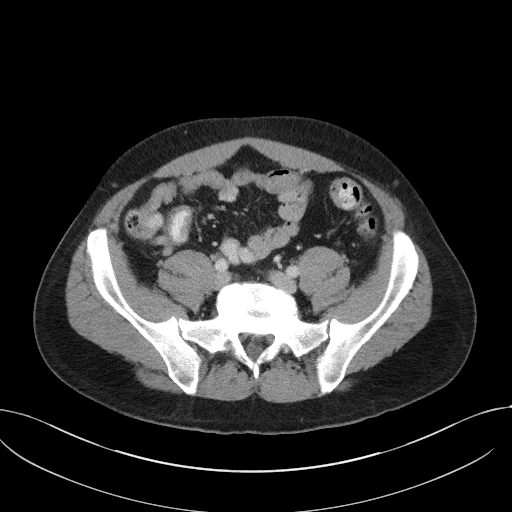
[im 47/103  soft-tissue]
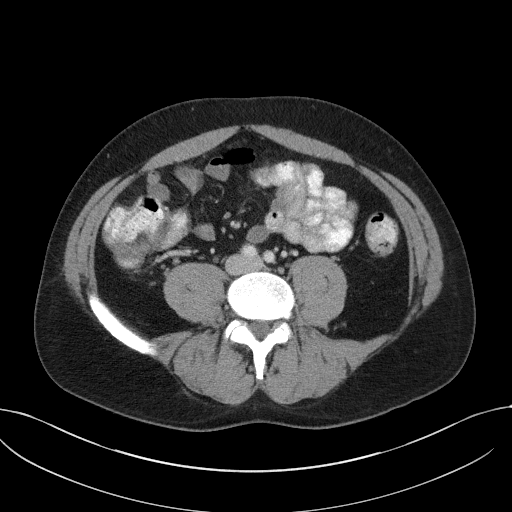
[im 56/103  soft-tissue]
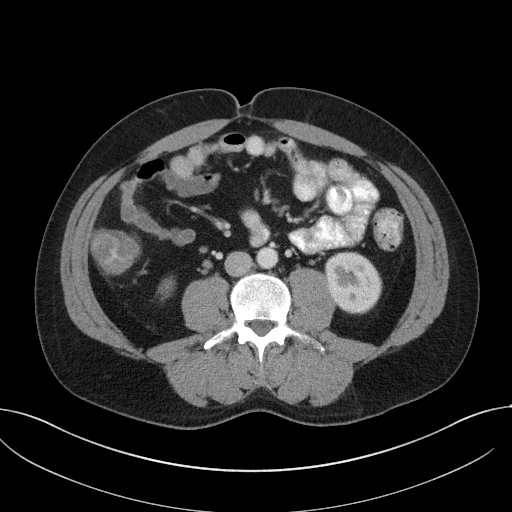
[im 64/103  soft-tissue]
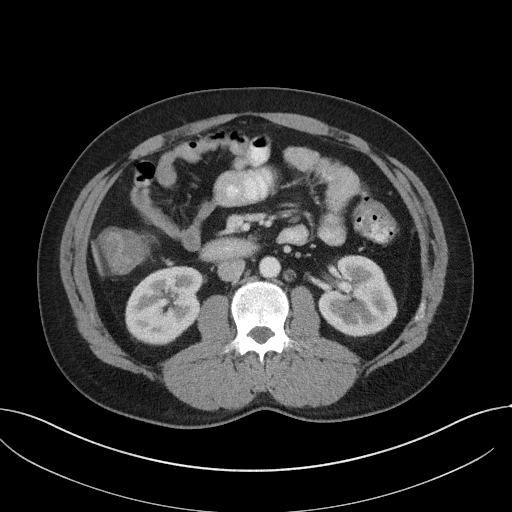
[im 73/103  soft-tissue]
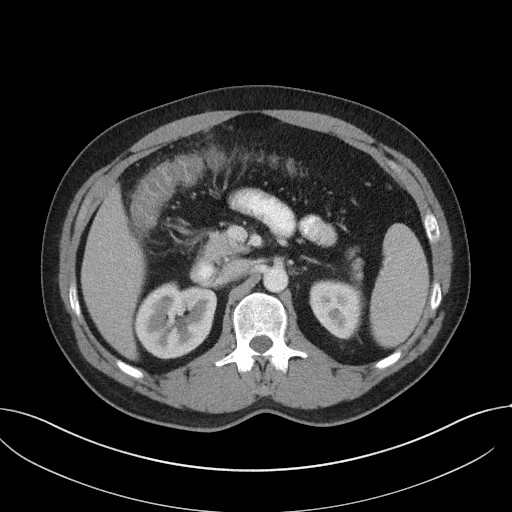
[im 73/103  bone]
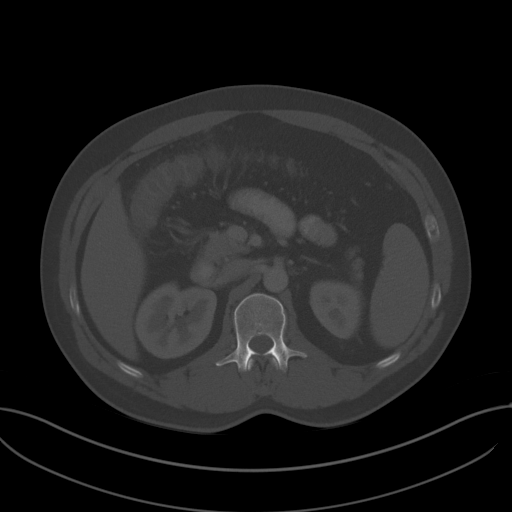
[im 81/103  soft-tissue]
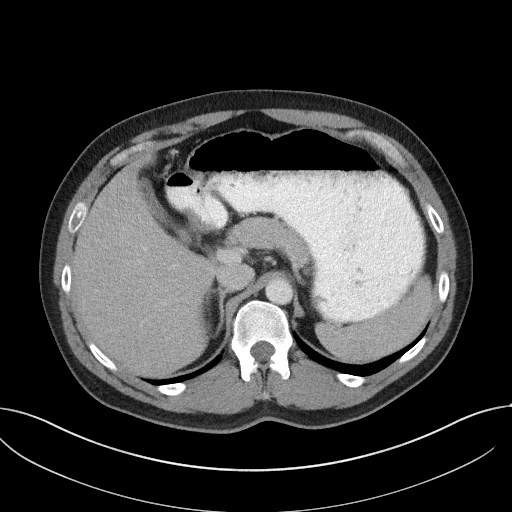
[im 90/103  soft-tissue]
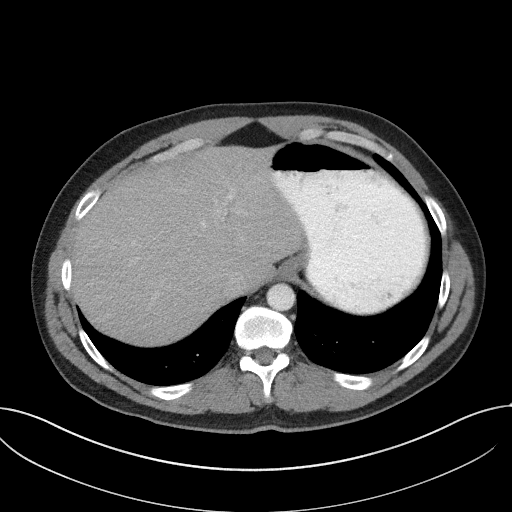
[im 98/103  soft-tissue]
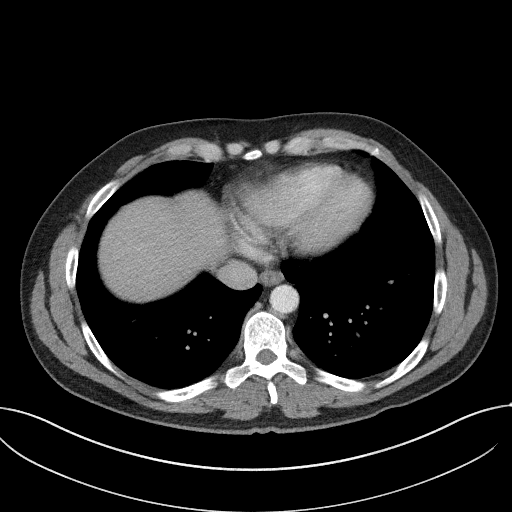

[Series 5: coronal st · coronal · 0.70mm/px · 3 of 90 slices shown]
[im 30/90  soft-tissue]
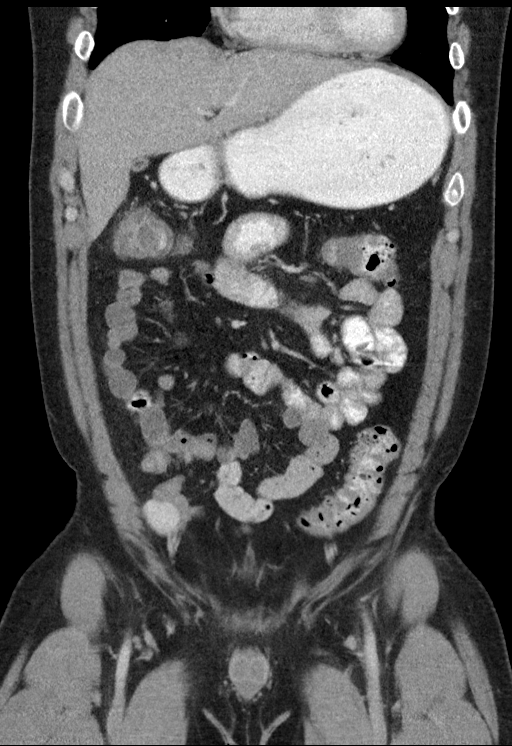
[im 40/90  soft-tissue]
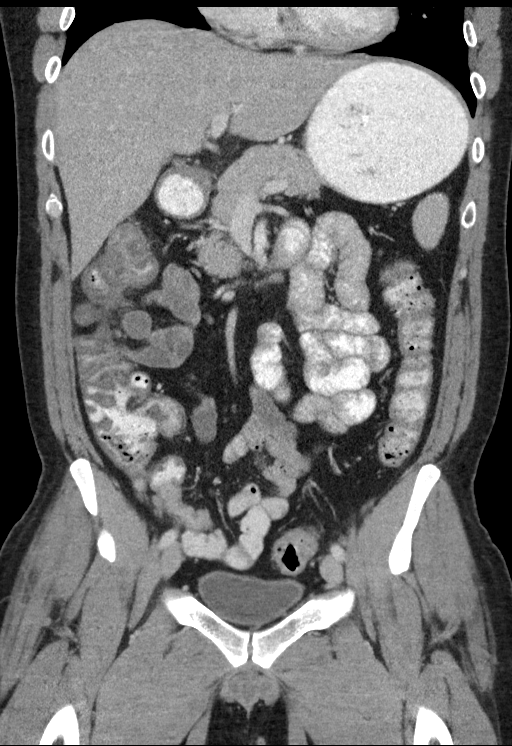
[im 50/90  soft-tissue]
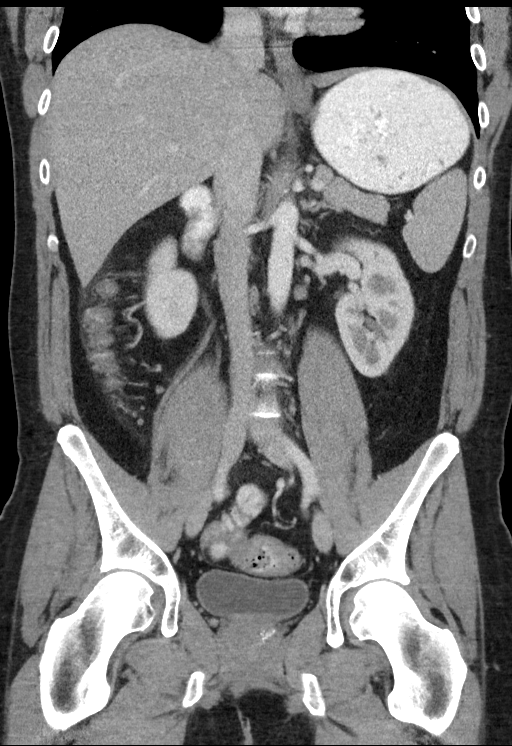

[15 of 46 positions shown; findings below may reference images not displayed]

FINDINGS: Lower chest: No acute abnormality.

Hepatobiliary: Liver segment 7 16 x 9 mm low-attenuation focus
probably representing a liver cysts. Otherwise no focal liver
abnormality is seen. No gallstones, gallbladder wall thickening, or
biliary dilatation.

Pancreas: Unremarkable. No pancreatic ductal dilatation or
surrounding inflammatory changes.

Spleen: Normal in size without focal abnormality.

Adrenals/Urinary Tract: Multiple right kidney nonobstructing stones
measuring up to 5 mm in the interpolar region. No hydronephrosis or
obstructive uropathy. Normal bladder. Normal adrenal glands.

Stomach/Bowel: Mild gastric distention. Normal appendix. Moderate
diffuse wall thickening of the colon greatest in the right
transverse and ascending segments compatible with colitis.
Additionally, there is mild wall thickening of the terminal ileum
(series 5, image 39). Small bowel is otherwise unremarkable.

Vascular/Lymphatic: No significant vascular findings are present. No
enlarged abdominal or pelvic lymph nodes.

Reproductive: Central prostatic calcifications and mild hypertrophy.

Other: No abdominal wall hernia or abnormality. No abdominopelvic
ascites.

Musculoskeletal: No fracture is seen. Moderate lumbar spondylosis at
the L5-S1 with loss of disc space height.
IMPRESSION: 1. Diffuse colitis greatest in the ascending and right transverse
segments which may be infectious or inflammatory including
ulcerative colitis or Crohn's disease. Additionally, there are
inflammatory changes of the terminal ileum. No evidence for
perforation or pericolonic abscess at this time.
2. Right kidney nonobstructing nephrolithiasis measuring up to 5 mm
in the interpolar region.

By: Wilner Ung M.D.

## 2018-11-26 ENCOUNTER — Other Ambulatory Visit: Payer: Self-pay | Admitting: Gastroenterology

## 2021-04-24 ENCOUNTER — Other Ambulatory Visit: Payer: Self-pay | Admitting: Otolaryngology

## 2021-04-24 DIAGNOSIS — R221 Localized swelling, mass and lump, neck: Secondary | ICD-10-CM

## 2021-04-25 ENCOUNTER — Ambulatory Visit
Admission: RE | Admit: 2021-04-25 | Discharge: 2021-04-25 | Disposition: A | Discharge status: Home / Self Care | Payer: BC Managed Care – PPO | Source: Ambulatory Visit | Attending: Otolaryngology | Admitting: Otolaryngology

## 2021-04-25 DIAGNOSIS — R221 Localized swelling, mass and lump, neck: Secondary | ICD-10-CM

## 2021-11-13 ENCOUNTER — Ambulatory Visit: Payer: BC Managed Care – PPO | Admitting: Internal Medicine

## 2021-11-16 ENCOUNTER — Ambulatory Visit: Payer: BC Managed Care – PPO | Admitting: Internal Medicine

## 2021-11-17 NOTE — Progress Notes (Signed)
Patient left without being seen.

## 2022-05-18 ENCOUNTER — Other Ambulatory Visit: Payer: Self-pay | Admitting: Otolaryngology

## 2022-05-18 DIAGNOSIS — J329 Chronic sinusitis, unspecified: Secondary | ICD-10-CM

## 2022-05-28 ENCOUNTER — Ambulatory Visit
Admission: RE | Admit: 2022-05-28 | Discharge: 2022-05-28 | Disposition: A | Discharge status: Home / Self Care | Payer: BC Managed Care – PPO | Source: Ambulatory Visit | Attending: Otolaryngology | Admitting: Otolaryngology

## 2022-05-28 DIAGNOSIS — J329 Chronic sinusitis, unspecified: Secondary | ICD-10-CM

## 2023-07-08 IMAGING — US US SOFT TISSUE HEAD/NECK
1 series · 7 of 7 positions shown · non-contrast
Comparison: None.

CLINICAL DATA: 45-year-old male with palpable mass in the right
neck.

EXAM:
ULTRASOUND OF HEAD/NECK SOFT TISSUES
TECHNIQUE: Ultrasound examination of the head and neck soft tissues was
performed in the area of clinical concern.

[Series 1: us soft tissue head/neck · 0.05mm/px · 7 of 7 slices shown]
[im 1/7]
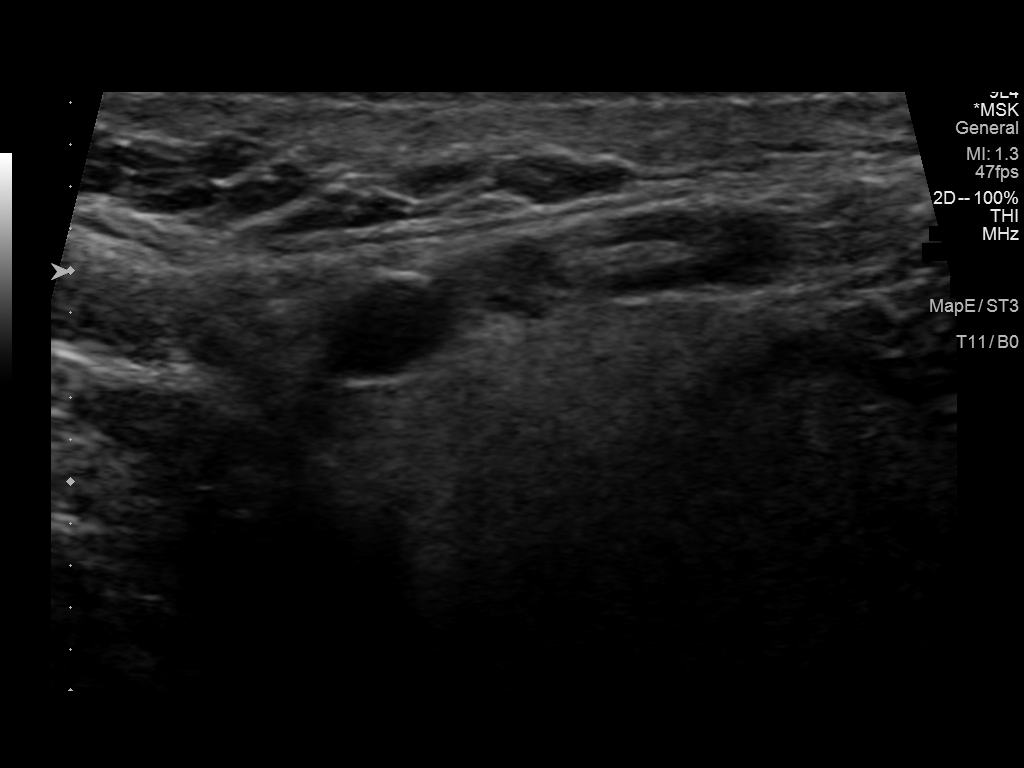
[im 2/7]
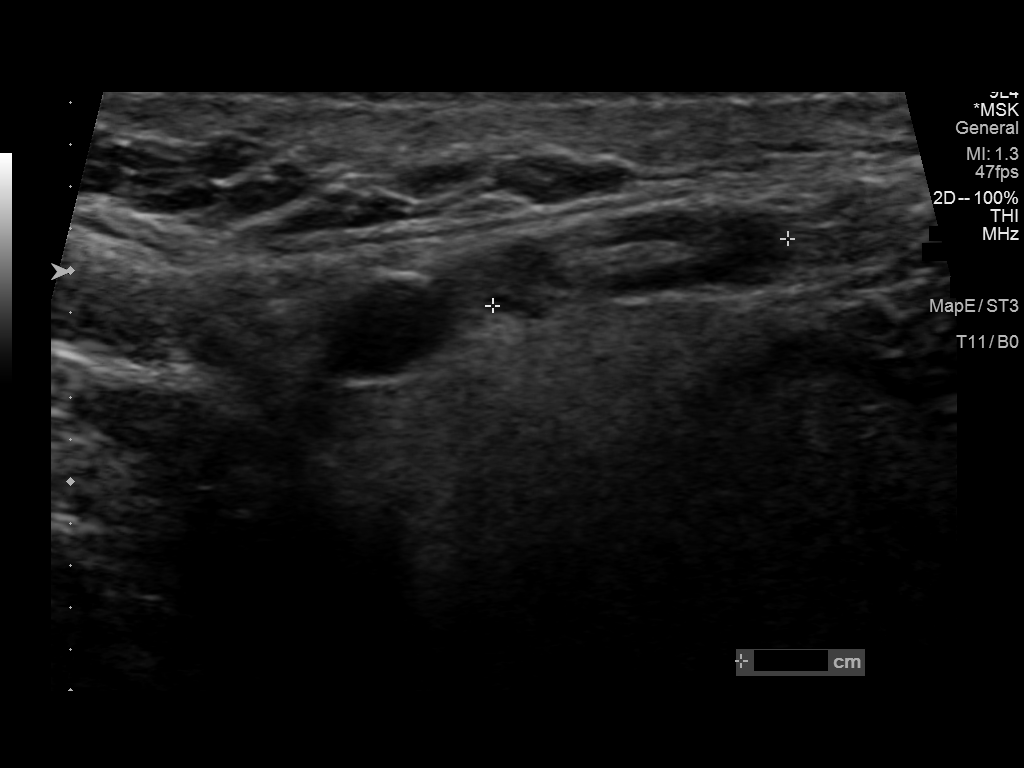
[im 3/7]
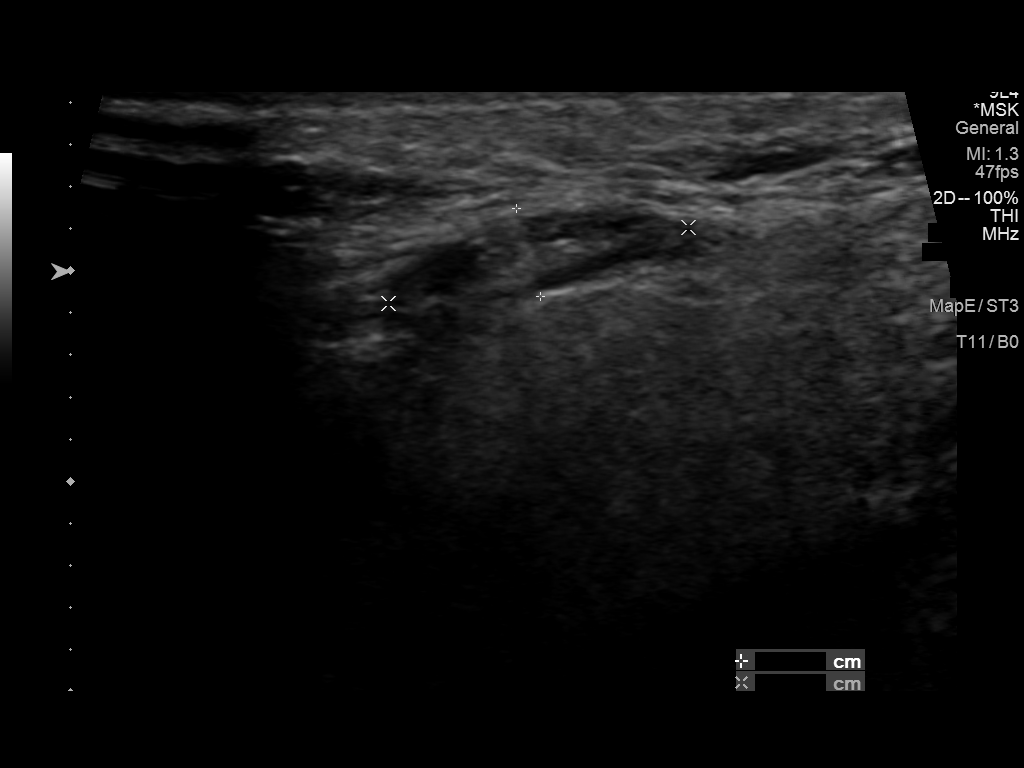
[im 4/7]
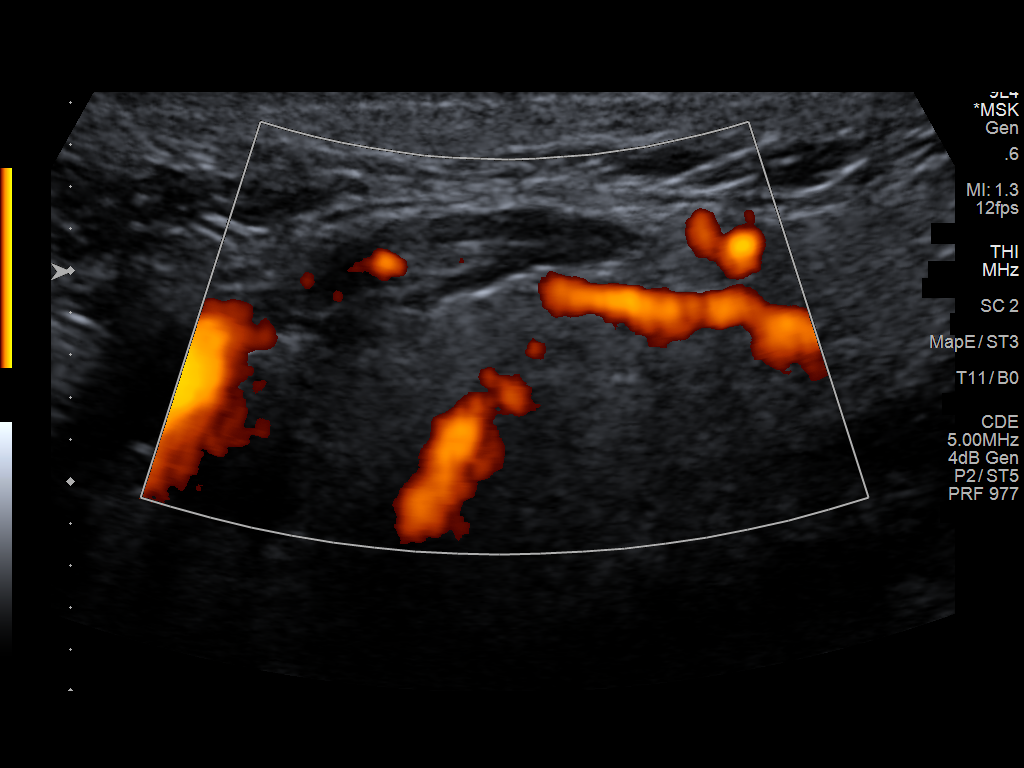
[im 5/7]
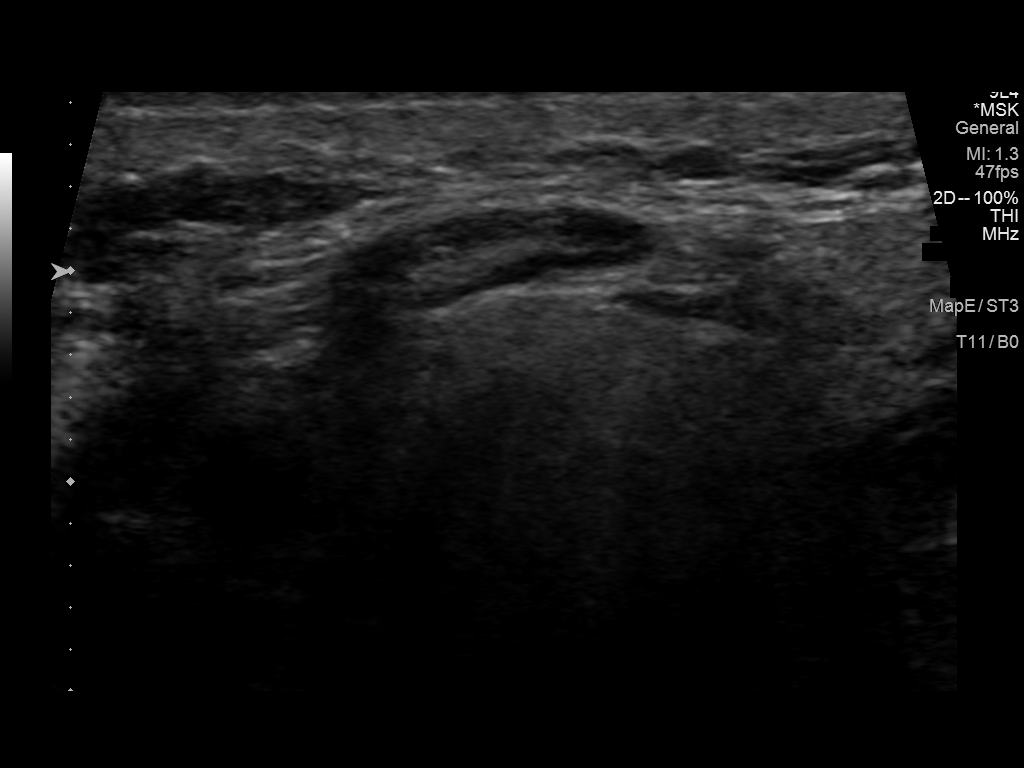
[im 6/7]
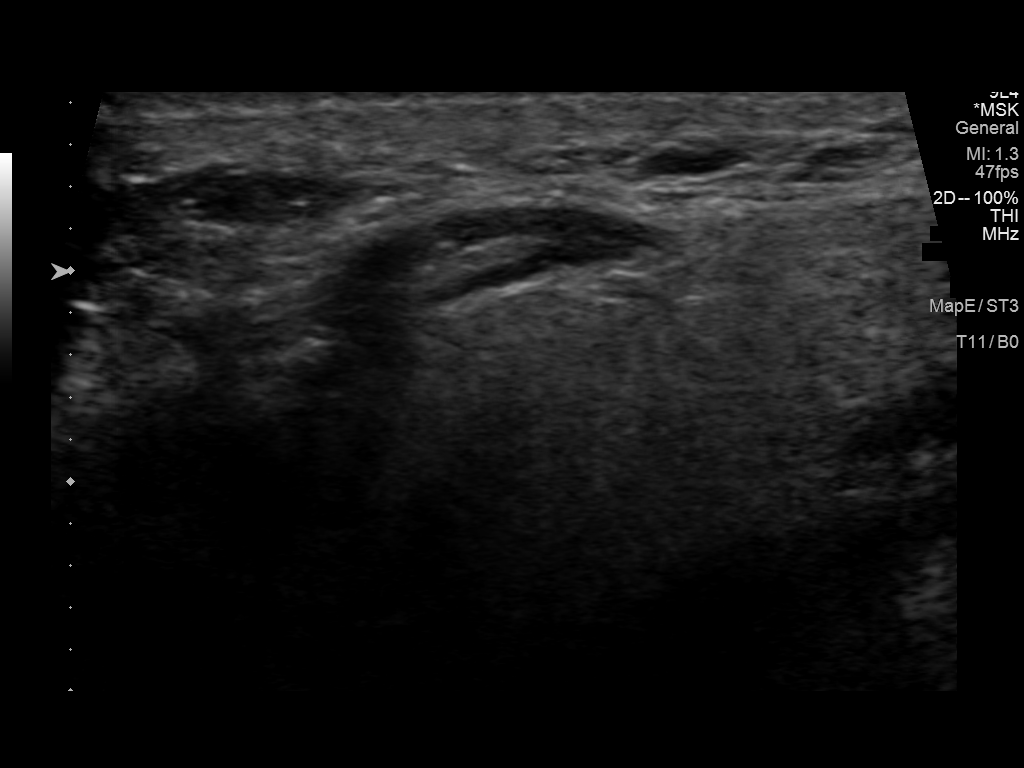
[im 7/7]
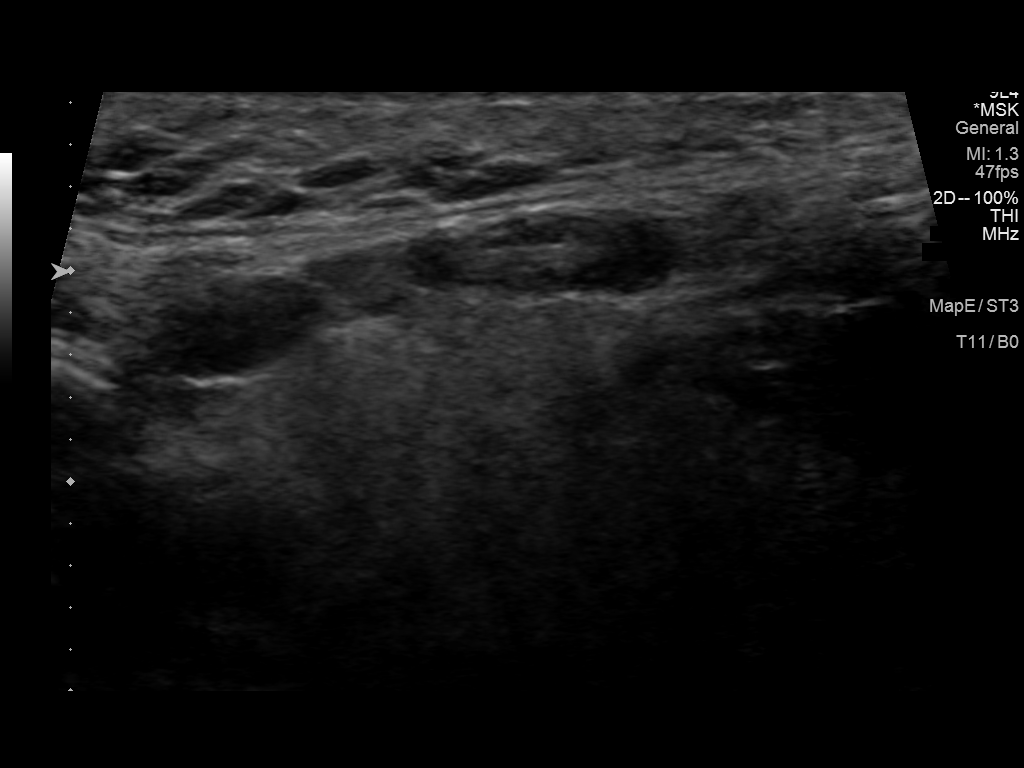

[7 of 7 positions shown; findings below may reference images not displayed]

FINDINGS: About the palpable area of concern of the right lateral neck is a
subcutaneous lymph node which measures up to 0.4 cm in short axis
without significant cortical thickening or destruction of the fatty
hilum. No soft tissue masses or other sonographic abnormalities are
visualized in the sonographic area of concern.
IMPRESSION: Normal appearing cervical lymph node in the right lateral neck
palpable area of concern.
